# Patient Record
Sex: Female | Born: 1985 | Race: Black or African American | Hispanic: No | Marital: Single | Smoking: Current every day smoker
Health system: Southern US, Community
[De-identification: ages and names within clinical notes are randomized; demographics above are authoritative.]

## PROBLEM LIST (undated history)

## (undated) DIAGNOSIS — B379 Candidiasis, unspecified: Secondary | ICD-10-CM

## (undated) DIAGNOSIS — A499 Bacterial infection, unspecified: Secondary | ICD-10-CM

## (undated) DIAGNOSIS — A599 Trichomoniasis, unspecified: Secondary | ICD-10-CM

## (undated) DIAGNOSIS — Z8619 Personal history of other infectious and parasitic diseases: Secondary | ICD-10-CM

## (undated) DIAGNOSIS — Z87440 Personal history of urinary (tract) infections: Secondary | ICD-10-CM

## (undated) DIAGNOSIS — B1910 Unspecified viral hepatitis B without hepatic coma: Secondary | ICD-10-CM

## (undated) DIAGNOSIS — L02419 Cutaneous abscess of limb, unspecified: Secondary | ICD-10-CM

## (undated) DIAGNOSIS — D573 Sickle-cell trait: Secondary | ICD-10-CM

## (undated) HISTORY — DX: Candidiasis, unspecified: B37.9

## (undated) HISTORY — DX: Bacterial infection, unspecified: A49.9

## (undated) HISTORY — DX: Trichomoniasis, unspecified: A59.9

## (undated) HISTORY — DX: Personal history of other infectious and parasitic diseases: Z86.19

## (undated) HISTORY — PX: NO PAST SURGERIES: SHX2092

## (undated) HISTORY — DX: Unspecified viral hepatitis B without hepatic coma: B19.10

## (undated) HISTORY — DX: Personal history of urinary (tract) infections: Z87.440

## (undated) HISTORY — DX: Cutaneous abscess of limb, unspecified: L02.419

---
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
## 2007-03-19 DIAGNOSIS — A599 Trichomoniasis, unspecified: Secondary | ICD-10-CM

## 2007-03-19 HISTORY — DX: Trichomoniasis, unspecified: A59.9

## 2009-08-31 ENCOUNTER — Emergency Department (HOSPITAL_COMMUNITY): Admission: EM | Admit: 2009-08-31 | Discharge: 2009-08-31 | Payer: Self-pay | Admitting: Emergency Medicine

## 2010-06-03 LAB — URINE CULTURE: Colony Count: >=100000

## 2010-06-03 LAB — URINE MICROSCOPIC-ADD ON

## 2010-06-03 LAB — URINALYSIS, ROUTINE W REFLEX MICROSCOPIC
Bilirubin Urine: NEGATIVE
Glucose, UA: NEGATIVE mg/dL
Ketones, ur: NEGATIVE mg/dL
Nitrite: NEGATIVE
Protein, ur: NEGATIVE mg/dL
Specific Gravity, Urine: 1.01 (ref 1.005–1.030)
Urobilinogen, UA: 0.2 mg/dL (ref 0.0–1.0)
pH: 6.5 (ref 5.0–8.0)

## 2010-06-03 LAB — POCT PREGNANCY, URINE: Preg Test, Ur: NEGATIVE

## 2010-11-10 ENCOUNTER — Emergency Department: Payer: Self-pay | Admitting: Emergency Medicine

## 2010-12-04 LAB — RPR: RPR: NONREACTIVE

## 2010-12-04 LAB — HIV ANTIBODY (ROUTINE TESTING W REFLEX): HIV: NONREACTIVE

## 2010-12-04 LAB — ANTIBODY SCREEN: Antibody Screen: NEGATIVE

## 2010-12-04 LAB — RUBELLA ANTIBODY, IGM: Rubella: IMMUNE

## 2010-12-04 LAB — HEPATITIS B SURFACE ANTIGEN: Hepatitis B Surface Ag: NEGATIVE

## 2010-12-27 LAB — CULTURE, ROUTINE-ABSCESS

## 2011-02-10 ENCOUNTER — Emergency Department (HOSPITAL_COMMUNITY)
Admission: EM | Admit: 2011-02-10 | Discharge: 2011-02-10 | Disposition: A | Discharge status: Home / Self Care | Payer: Medicaid Other | Attending: Emergency Medicine | Admitting: Emergency Medicine

## 2011-02-10 DIAGNOSIS — J069 Acute upper respiratory infection, unspecified: Secondary | ICD-10-CM

## 2011-02-10 DIAGNOSIS — J45909 Unspecified asthma, uncomplicated: Secondary | ICD-10-CM | POA: Insufficient documentation

## 2011-02-10 DIAGNOSIS — O99891 Other specified diseases and conditions complicating pregnancy: Secondary | ICD-10-CM | POA: Insufficient documentation

## 2011-02-10 DIAGNOSIS — J029 Acute pharyngitis, unspecified: Secondary | ICD-10-CM | POA: Insufficient documentation

## 2011-02-10 DIAGNOSIS — Z87891 Personal history of nicotine dependence: Secondary | ICD-10-CM | POA: Insufficient documentation

## 2011-02-10 LAB — RAPID STREP SCREEN (MED CTR MEBANE ONLY): Streptococcus, Group A Screen (Direct): NEGATIVE

## 2011-02-10 MED ORDER — OXYMETAZOLINE HCL 0.05 % NA SOLN
2.0000 | Freq: Two times a day (BID) | NASAL | Status: DC
  Administered 2011-02-10: 2 via NASAL
  Filled 2011-02-10: qty 15

## 2011-02-10 MED ORDER — ACETAMINOPHEN 500 MG PO TABS
1000.0000 mg | ORAL_TABLET | Freq: Once | ORAL | Status: AC
  Administered 2011-02-10: 1000 mg via ORAL
  Filled 2011-02-10: qty 2

## 2011-02-10 NOTE — ED Provider Notes (Signed)
Medical screening examination/treatment/procedure(s) were performed by non-physician practitioner and as supervising physician I was immediately available for consultation/collaboration.  Camani Sesay, MD 02/10/11 2055 

## 2011-02-10 NOTE — ED Notes (Signed)
Pt presents with cough, sore throat, fever, and body aches since Friday. Pt states she is 6 month pregnant.

## 2011-02-10 NOTE — ED Provider Notes (Signed)
History     CSN: [id number] Arrival date & time: 02/10/2011  4:40 PM   First MD Initiated Contact with Patient 02/10/11 1641      Chief Complaint  Patient presents with  . Sore Throat  . Generalized Body Aches  . Fever  . Cough    (Consider location/radiation/quality/duration/timing/severity/associated sxs/prior treatment) HPI Comments: Pt report 2 to 3 days of sore throat, fever, cough, and body aches. Pt is 6 months pregnant. She has not been able to measure temp at home. No reported problem with the pregnancy. No dysuria.  Patient is a 25 y.o. female presenting with pharyngitis, fever, and cough. The history is provided by the patient.  Sore Throat This is a new problem. The current episode started in the past 7 days. The problem occurs 2 to 4 times per day. The problem has been unchanged. Associated symptoms include coughing, a fever, headaches, myalgias and a sore throat. Pertinent negatives include no abdominal pain, arthralgias, change in bowel habit, chest pain, neck pain, rash, visual change or vomiting. The symptoms are aggravated by swallowing. She has tried nothing for the symptoms. The treatment provided no relief.  Fever Primary symptoms of the febrile illness include fever, headaches, cough and myalgias. Primary symptoms do not include visual change, wheezing, shortness of breath, abdominal pain, vomiting, dysuria, arthralgias or rash.  The headache is not associated with photophobia or visual change.  Cough Associated symptoms include headaches, sore throat and myalgias. Pertinent negatives include no chest pain, no shortness of breath and no wheezing.    Past Medical History  Diagnosis Date  . Asthma     History reviewed. No pertinent past surgical history.  No family history on file.  History  Substance Use Topics  . Smoking status: Former Games developer  . Smokeless tobacco: Not on file  . Alcohol Use: No    OB History    Grav Para Term Preterm Abortions TAB  SAB Ect Mult Living   3    2 1 1    0      Review of Systems  Constitutional: Positive for fever. Negative for activity change.       All ROS Neg except as noted in HPI  HENT: Positive for sore throat. Negative for nosebleeds and neck pain.   Eyes: Negative for photophobia and discharge.  Respiratory: Positive for cough. Negative for shortness of breath and wheezing.   Cardiovascular: Negative for chest pain and palpitations.  Gastrointestinal: Negative for vomiting, abdominal pain, blood in stool and change in bowel habit.  Genitourinary: Negative for dysuria, frequency and hematuria.  Musculoskeletal: Positive for myalgias. Negative for back pain and arthralgias.  Skin: Negative.  Negative for rash.  Neurological: Positive for headaches. Negative for dizziness, seizures and speech difficulty.  Psychiatric/Behavioral: Negative for hallucinations and confusion.    Allergies  Review of patient's allergies indicates no known allergies.  Home Medications  No current outpatient prescriptions on file.  BP 115/67  Pulse 141  Temp(Src) 99.6 F (37.6 C) (Oral)  Resp 20  Ht 5\' 5"  (1.651 m)  Wt 150 lb (68.04 kg)  BMI 24.96 kg/m2  SpO2 100%  Physical Exam  Nursing note and vitals reviewed. Constitutional: She is oriented to person, place, and time. She appears well-developed and well-nourished.  Non-toxic appearance.  HENT:  Head: Normocephalic.  Right Ear: Tympanic membrane and external ear normal.  Left Ear: Tympanic membrane and external ear normal.       Nasal congestion noted. Mild increase redness of  the posterior pharynx. Airway clear.  Eyes: EOM and lids are normal. Pupils are equal, round, and reactive to light.  Neck: Normal range of motion. Neck supple. Carotid bruit is not present.  Cardiovascular: Normal rate, regular rhythm, normal heart sounds, intact distal pulses and normal pulses.   Pulmonary/Chest: Breath sounds normal. No respiratory distress.  Abdominal: Soft.  Bowel sounds are normal. There is no tenderness. There is no guarding.  Musculoskeletal: Normal range of motion.  Lymphadenopathy:       Head (right side): No submandibular adenopathy present.       Head (left side): No submandibular adenopathy present.    She has no cervical adenopathy.  Neurological: She is alert and oriented to person, place, and time. She has normal strength. No cranial nerve deficit or sensory deficit.  Skin: Skin is warm and dry.  Psychiatric: She has a normal mood and affect. Her speech is normal.    ED Course  Procedures (including critical care time)   Labs Reviewed  RAPID STREP SCREEN   No results found.   Dx: 1. Pharyngitis  2. URI  3. pregnancy   MDM  I have reviewed nursing notes, vital signs, and all appropriate lab and imaging results for this patient.        Kathie Dike, [place] 02/10/11 1807

## 2011-03-19 NOTE — L&D Delivery Note (Signed)
Delivery Note  Pt had urge to push and was complete and pushed well over 3 ctx, FHR remained stable at 140's with mild occ variables  At 4:39 AM a viable female was delivered via Vaginal, Spontaneous Delivery (Presentation: ROA;  ).  APGAR: 9, 9; weight 7 lb 14.5 oz (3585 g).   Placenta status: Intact, Spontaneous.  Cord: 3 vessels with the following complications: None. Prolonged ROM   Anesthesia: Local  Episiotomy: None Lacerations: 1st degree;Perineal, R periurethral  Suture Repair: 3.0 vicryl rapide Est. Blood Loss (mL): 350cc  Mom to postpartum.  Baby to nursery-stable. Plans to BF, plans outpatient circumcision   Malissa Hippo 06/02/2011, 5:31 AM

## 2011-05-21 ENCOUNTER — Encounter (INDEPENDENT_AMBULATORY_CARE_PROVIDER_SITE_OTHER): Payer: Medicaid Other | Admitting: Registered Nurse

## 2011-05-21 DIAGNOSIS — Z3480 Encounter for supervision of other normal pregnancy, unspecified trimester: Secondary | ICD-10-CM

## 2011-05-29 ENCOUNTER — Encounter (INDEPENDENT_AMBULATORY_CARE_PROVIDER_SITE_OTHER): Payer: Medicaid Other | Admitting: Obstetrics and Gynecology

## 2011-05-29 DIAGNOSIS — Z331 Pregnant state, incidental: Secondary | ICD-10-CM

## 2011-06-01 ENCOUNTER — Inpatient Hospital Stay (HOSPITAL_COMMUNITY)
Admission: AD | Admit: 2011-06-01 | Discharge: 2011-06-04 | DRG: 775 | Disposition: A | Discharge status: Home / Self Care | Payer: Medicaid Other | Source: Ambulatory Visit | Attending: Obstetrics and Gynecology | Admitting: Obstetrics and Gynecology

## 2011-06-01 ENCOUNTER — Encounter (HOSPITAL_COMMUNITY): Payer: Self-pay | Admitting: *Deleted

## 2011-06-01 DIAGNOSIS — B009 Herpesviral infection, unspecified: Secondary | ICD-10-CM | POA: Diagnosis not present

## 2011-06-01 DIAGNOSIS — D573 Sickle-cell trait: Secondary | ICD-10-CM | POA: Diagnosis present

## 2011-06-01 DIAGNOSIS — O9902 Anemia complicating childbirth: Secondary | ICD-10-CM | POA: Diagnosis present

## 2011-06-01 DIAGNOSIS — D649 Anemia, unspecified: Secondary | ICD-10-CM | POA: Diagnosis present

## 2011-06-01 DIAGNOSIS — O4290 Premature rupture of membranes, unspecified as to length of time between rupture and onset of labor, unspecified weeks of gestation: Secondary | ICD-10-CM | POA: Diagnosis present

## 2011-06-01 HISTORY — DX: Sickle-cell trait: D57.3

## 2011-06-01 LAB — CBC
HCT: 32.7 % — ABNORMAL LOW (ref 36.0–46.0)
Hemoglobin: 11.5 g/dL — ABNORMAL LOW (ref 12.0–15.0)
WBC: 8.8 10*3/uL (ref 4.0–10.5)

## 2011-06-01 MED ORDER — LACTATED RINGERS IV SOLN: 500.0000 mL | INTRAVENOUS | Status: DC | PRN

## 2011-06-01 MED ORDER — OXYCODONE-ACETAMINOPHEN 5-325 MG PO TABS: 1.0000 | ORAL_TABLET | ORAL | Status: DC | PRN

## 2011-06-01 MED ORDER — OXYTOCIN BOLUS FROM INFUSION
500.0000 mL | Freq: Once | INTRAVENOUS | Status: DC
  Filled 2011-06-01: qty 500
  Filled 2011-06-01: qty 1000

## 2011-06-01 MED ORDER — IBUPROFEN 600 MG PO TABS: 600.0000 mg | ORAL_TABLET | Freq: Four times a day (QID) | ORAL | Status: DC | PRN

## 2011-06-01 MED ORDER — OXYTOCIN 20 UNITS IN LACTATED RINGERS INFUSION - SIMPLE
125.0000 mL/h | Freq: Once | INTRAVENOUS | Status: AC
  Administered 2011-06-02: 999 mL/h via INTRAVENOUS

## 2011-06-01 MED ORDER — OXYTOCIN 10 UNIT/ML IJ SOLN: 10.0000 [IU] | Freq: Once | INTRAMUSCULAR | Status: DC

## 2011-06-01 MED ORDER — ONDANSETRON HCL 4 MG/2ML IJ SOLN: 4.0000 mg | Freq: Four times a day (QID) | INTRAMUSCULAR | Status: DC | PRN

## 2011-06-01 MED ORDER — LIDOCAINE HCL (PF) 1 % IJ SOLN
30.0000 mL | INTRAMUSCULAR | Status: DC | PRN
  Administered 2011-06-02: 30 mL via SUBCUTANEOUS
  Filled 2011-06-01: qty 30

## 2011-06-01 MED ORDER — FENTANYL CITRATE 0.05 MG/ML IJ SOLN
100.0000 ug | INTRAMUSCULAR | Status: DC | PRN
  Administered 2011-06-01 – 2011-06-02 (×5): 100 ug via INTRAVENOUS
  Filled 2011-06-01 (×6): qty 2

## 2011-06-01 MED ORDER — LACTATED RINGERS IV SOLN
INTRAVENOUS | Status: DC
  Administered 2011-06-01: 23:00:00 via INTRAVENOUS

## 2011-06-01 MED ORDER — ACETAMINOPHEN 325 MG PO TABS: 650.0000 mg | ORAL_TABLET | ORAL | Status: DC | PRN

## 2011-06-01 MED ORDER — CITRIC ACID-SODIUM CITRATE 334-500 MG/5ML PO SOLN
30.0000 mL | ORAL | Status: DC | PRN
  Filled 2011-06-01: qty 15

## 2011-06-01 NOTE — H&P (Signed)
Tina Hawkins is a 26 y.o. female presenting for onset of regular ctx since 2000, started having sporadic ctx since 0500 this am. Reports losing "mucous plug" on Thursday at 4pm and has felt "wet" since than, using pantiliner, noted bloody show since around 8pm. Reports GFM. Pt denies any s/s of HSV, has been on valtrex since NOB visit with active lesion noted at that time.  Unable to determine exact time for SROM.  Pregnancy significant for:  1. HSV - confidential  2. Remote hx asthma 3. SCT 4. Anemia 5. LTC 6. Hx trichomonas   HPI: Pt began [facility] at 14wks, she had a dating [place] at that time with [employer] 06/01/11, and unk LMP. She was started on valtrex at [facility] for +HSV lesion. Anatomy US and Quad screen were normal at 18wks. Rest of prenatal course was routine.   Maternal Medical History:  Reason for admission: Reason for admission: rupture of membranes and contractions.  Contractions: Onset was 3-5 hours ago.   Frequency: regular.   Duration is approximately 60 seconds.   Perceived severity is strong.    Fetal activity: Perceived fetal activity is normal.   Last perceived fetal movement was within the past hour.    Prenatal complications: no prenatal complications   OB History    Grav Para Term Preterm Abortions TAB SAB Ect Mult Living   4    3 2 1    0     2007 IAM at 10wks with heavy bleeding after and pt report of retained products 2010 SAB no comps  Past Medical History  Diagnosis Date  . Asthma   . Sickle cell trait    Past Surgical History  Procedure Date  . No past surgeries    Family History: family history includes Cancer in her maternal aunt; Diabetes in her maternal aunt; Heart disease in her mother; and Stroke in her mother. Social History:  reports that she has quit smoking. She does not have any smokeless tobacco history on file. She reports that she uses illicit drugs (Marijuana). She reports that she does not drink alcohol.  Review of Systems    Genitourinary:       Repots losing mucous plug about 4pm on thurs 03-14,  All other systems reviewed and are negative.    Dilation: 2 Effacement (%): 100 Station: 0 There were no vitals taken for this visit. Maternal Exam:  Uterine Assessment: Contraction strength is moderate.  Contraction duration is 60 seconds. Contraction frequency is regular.   Abdomen: Fundal height is aga.   Estimated fetal weight is 7-8.   Fetal presentation: vertex  Introitus: Normal vulva. Vagina is positive for vaginal discharge.  Ferning test: positive.  Amniotic fluid character: clear.  Pelvis: adequate for delivery.   Cervix: Cervix evaluated by digital exam.     Fetal Exam Fetal Monitor Review: Mode: ultrasound.   Baseline rate: 140.  Variability: moderate (6-25 bpm).   Pattern: accelerations present and no decelerations.    Fetal State Assessment: Category I - tracings are normal.     Physical Exam  Nursing note and vitals reviewed. Constitutional: She is oriented to person, place, and time. She appears well-developed and well-nourished. She appears distressed.       Grimaces and moans with ctx  HENT:  Head: Normocephalic.  Neck: Normal range of motion.  Cardiovascular: Normal rate and regular rhythm.   Respiratory: Effort normal and breath sounds normal.  GI: Soft. There is no tenderness.  Gravid, NT  Genitourinary: Vaginal discharge found.       Clear fluid, pos fern, spec exam clear no s/s HSV noted VE=2/100/0   Musculoskeletal: Normal range of motion. She exhibits no edema.  Neurological: She is alert and oriented to person, place, and time.  Skin: Skin is warm. She is diaphoretic.  Psychiatric: She has a normal mood and affect. Her behavior is normal. Judgment and thought content normal.    Prenatal labs: ABO, Rh: O/Positive/-- (09/18 0000) Antibody: Negative (09/18 0000) Rubella: Immune (09/18 0000) RPR: Nonreactive (09/18 0000)  HBsAg: Negative (09/18 0000)   HIV: Non-reactive (09/18 0000)  GBS: Negative (02/19 0000)  GC/CT neg Quad neg 28wk labs - 1hr gtt 114, hgb 10.3, RPR NR  Assessment/Plan: IUP at 40wks ? SROM time - possibly 2 days Early/active labor Hx HSV - confidential no current s/s GBS neg FHR reassuring  Admit to B.S. Dr Normand Sloop attending Routine CNM orders Fentanyl IVP for now, pt may want epidural Will watch closely for s/s chorioamnionitis    Tina Hawkins M 06/01/2011, 10:35 PM

## 2011-06-01 NOTE — Progress Notes (Signed)
Gevena Barre CNM in discussing admission and pain management with pt

## 2011-06-01 NOTE — MAU Note (Signed)
Pt states, " I've been leaking water for two days and I've been contracting on and off for 2-3 days, but regular and strong for one and half hours."

## 2011-06-01 NOTE — MAU Note (Signed)
Pt brought straight back to RM #7 via w/c due to discomfort from contractions

## 2011-06-01 NOTE — MAU Note (Signed)
Report called to Christy RN in BS. Will call back with room number. 

## 2011-06-01 NOTE — Progress Notes (Signed)
To Rm 168 via  w/c

## 2011-06-02 ENCOUNTER — Encounter (HOSPITAL_COMMUNITY): Payer: Self-pay | Admitting: *Deleted

## 2011-06-02 MED ORDER — BENZOCAINE-MENTHOL 20-0.5 % EX AERO
INHALATION_SPRAY | CUTANEOUS | Status: AC
  Administered 2011-06-02: 08:00:00
  Filled 2011-06-02: qty 56

## 2011-06-02 MED ORDER — PRENATAL MULTIVITAMIN CH
1.0000 | ORAL_TABLET | Freq: Every day | ORAL | Status: DC
  Administered 2011-06-02 – 2011-06-04 (×3): 1 via ORAL
  Filled 2011-06-02 (×3): qty 1

## 2011-06-02 MED ORDER — DIBUCAINE 1 % RE OINT: 1.0000 "application " | TOPICAL_OINTMENT | RECTAL | Status: DC | PRN

## 2011-06-02 MED ORDER — ZOLPIDEM TARTRATE 5 MG PO TABS: 5.0000 mg | ORAL_TABLET | Freq: Every evening | ORAL | Status: DC | PRN

## 2011-06-02 MED ORDER — LANOLIN HYDROUS EX OINT: TOPICAL_OINTMENT | CUTANEOUS | Status: DC | PRN

## 2011-06-02 MED ORDER — TETANUS-DIPHTH-ACELL PERTUSSIS 5-2.5-18.5 LF-MCG/0.5 IM SUSP
0.5000 mL | Freq: Once | INTRAMUSCULAR | Status: AC
  Administered 2011-06-03: 0.5 mL via INTRAMUSCULAR
  Filled 2011-06-02: qty 0.5

## 2011-06-02 MED ORDER — ONDANSETRON HCL 4 MG/2ML IJ SOLN: 4.0000 mg | INTRAMUSCULAR | Status: DC | PRN

## 2011-06-02 MED ORDER — BENZOCAINE-MENTHOL 20-0.5 % EX AERO: 1.0000 "application " | INHALATION_SPRAY | CUTANEOUS | Status: DC | PRN

## 2011-06-02 MED ORDER — IBUPROFEN 600 MG PO TABS
600.0000 mg | ORAL_TABLET | Freq: Four times a day (QID) | ORAL | Status: DC
  Administered 2011-06-02 – 2011-06-04 (×10): 600 mg via ORAL
  Filled 2011-06-02 (×10): qty 1

## 2011-06-02 MED ORDER — ONDANSETRON HCL 4 MG PO TABS: 4.0000 mg | ORAL_TABLET | ORAL | Status: DC | PRN

## 2011-06-02 MED ORDER — DIPHENHYDRAMINE HCL 25 MG PO CAPS: 25.0000 mg | ORAL_CAPSULE | Freq: Four times a day (QID) | ORAL | Status: DC | PRN

## 2011-06-02 MED ORDER — OXYCODONE-ACETAMINOPHEN 5-325 MG PO TABS
1.0000 | ORAL_TABLET | ORAL | Status: DC | PRN
  Administered 2011-06-03 – 2011-06-04 (×2): 1 via ORAL
  Filled 2011-06-02 (×2): qty 1

## 2011-06-02 MED ORDER — SENNOSIDES-DOCUSATE SODIUM 8.6-50 MG PO TABS
2.0000 | ORAL_TABLET | Freq: Every day | ORAL | Status: DC
  Administered 2011-06-02 – 2011-06-03 (×2): 2 via ORAL

## 2011-06-02 MED ORDER — WITCH HAZEL-GLYCERIN EX PADS: 1.0000 "application " | MEDICATED_PAD | CUTANEOUS | Status: DC | PRN

## 2011-06-02 MED ORDER — MEASLES, MUMPS & RUBELLA VAC ~~LOC~~ INJ
0.5000 mL | INJECTION | Freq: Once | SUBCUTANEOUS | Status: DC
  Filled 2011-06-02: qty 0.5

## 2011-06-02 MED ORDER — SIMETHICONE 80 MG PO CHEW: 80.0000 mg | CHEWABLE_TABLET | ORAL | Status: DC | PRN

## 2011-06-02 NOTE — Progress Notes (Signed)
Post Partum Day 0--delivered at 4:39am.  Now on Mother-Baby Subjective: no complaints.  Doing well, up ad lib without syncope or dizziness.  Objective: Blood pressure 110/71, pulse 106, temperature 98.6 F (37 C), temperature source Oral, resp. rate 16, height 5\' 4"  (1.626 m), weight 172 lb (78.019 kg), unknown if currently breastfeeding.  Physical Exam:  General: alert Lochia: appropriate Uterine Fundus: firm Incision: healing well DVT Evaluation: No evidence of DVT seen on physical exam. Negative Homan's sign.   Basename 06/01/11 2300  HGB 11.5*  HCT 32.7*    Assessment/Plan: Day 0 pp--stable Continue current care.   LOS: 1 day   Nigel Bridgeman 06/02/2011, 8:18 AM

## 2011-06-02 NOTE — Progress Notes (Signed)
Patient ID: CARMAN AUXIER, female   DOB: 03-21-1985, 26 y.o.   MRN: [id number] .Subjective:  Has rcv'd 4 doses fentanyl, with good results, not as much relief with last dose, family remains supportive at bs  Objective: BP 132/70  Pulse 80  Temp(Src) 98.6 F (37 C) (Oral)  Resp 20  Ht 5\' 4"  (1.626 m)  Wt 78.019 kg (172 lb)  BMI 29.52 kg/m2   FHT:  130, cat 1, occ variables, ? Early toco not tracing UC:   regular, every 2-3  minutes SVE:   Dilation: 7 Effacement (%): 100 Station: +1 Exam by:: s Darin Redmann cnm    Assessment / Plan: Spontaneous labor, progressing normally GBS neg  ? Prolonged ROM - no sx's chorioamnionitis   Fetal Wellbeing:  Category I Pain Control:  Fentanyl  Update physician PRN  Kadeshia Kasparian M 06/02/2011, 3:55 AM

## 2011-06-03 LAB — CBC
HCT: 24.9 % — ABNORMAL LOW (ref 36.0–46.0)
Hemoglobin: 8.7 g/dL — ABNORMAL LOW (ref 12.0–15.0)
MCH: 31.5 pg (ref 26.0–34.0)
MCHC: 34.9 g/dL (ref 30.0–36.0)
MCV: 90.2 fL (ref 78.0–100.0)
RDW: 13.2 % (ref 11.5–15.5)

## 2011-06-03 MED ORDER — POLYSACCHARIDE IRON COMPLEX 150 MG PO CAPS
150.0000 mg | ORAL_CAPSULE | Freq: Two times a day (BID) | ORAL | Status: DC
  Administered 2011-06-03 – 2011-06-04 (×2): 150 mg via ORAL
  Filled 2011-06-03 (×3): qty 1

## 2011-06-03 NOTE — Progress Notes (Signed)
Post Partum Day 1 Subjective: voiding, tolerating PO, + flatus and working on breastfeeding.  No dizziness thus far.  Per pt's RN, normal orthostatic VS.  VB lighter this AM.  Hispanic female at bedside holding newborn.    Objective: Blood pressure 106/72, pulse 109, temperature 98.4 F (36.9 C), temperature source Oral, resp. rate 20, height 5\' 4"  (1.626 m), weight 78.019 kg (172 lb), unknown if currently breastfeeding.  Physical Exam:  General: alert, cooperative and no distress Lochia: appropriate Uterine Fundus: firm Incision: n/a DVT Evaluation: No evidence of DVT seen on physical exam. Negative Homan's sign. No significant calf/ankle edema. .. Filed Vitals:   06/03/11 0519 06/03/11 0805 06/03/11 0808 06/03/11 0812  BP: 110/64 107/63 108/68 106/72  Pulse: 101 99 103 109  Temp: 98.4 F (36.9 C)     TempSrc: Oral     Resp: 20     Height:      Weight:        Basename 06/03/11 0525 06/01/11 2300  HGB 8.7* 11.5*  HCT 24.9* 32.7*    Assessment/Plan: Plan for discharge tomorrow, Breastfeeding and Lactation consult PP anemia.    Disc'd slow position changes. Will start po Fe supplement.   Planning outpatient circ.  LOS: 2 days   Claxton Levitz H 06/03/2011, 11:50 AM

## 2011-06-03 NOTE — Progress Notes (Signed)
UR chart review completed.  

## 2011-06-04 ENCOUNTER — Encounter: Payer: Medicaid Other | Admitting: Obstetrics and Gynecology

## 2011-06-04 MED ORDER — IBUPROFEN 600 MG PO TABS: 600.0000 mg | ORAL_TABLET | Freq: Four times a day (QID) | ORAL | Status: AC | PRN

## 2011-06-04 MED ORDER — DOCUSATE SODIUM 100 MG PO CAPS: 100.0000 mg | ORAL_CAPSULE | Freq: Two times a day (BID) | ORAL | Status: AC

## 2011-06-04 MED ORDER — FERROUS GLUCONATE IRON 246 (28 FE) MG PO TABS: 246.0000 mg | ORAL_TABLET | Freq: Every day | ORAL | Status: DC

## 2011-06-04 MED ORDER — NORETHINDRONE 0.35 MG PO TABS: 1.0000 | ORAL_TABLET | Freq: Every day | ORAL | Status: DC

## 2011-06-04 NOTE — Discharge Instructions (Signed)
Anemia, Frequently Asked Questions WHAT ARE THE SYMPTOMS OF ANEMIA?  Headache.   Difficulty thinking.   Fatigue.   Shortness of breath.   Weakness.   Rapid heartbeat.  AT WHAT POINT ARE PEOPLE CONSIDERED ANEMIC?  This varies with gender and age.   Both hemoglobin (Hgb) and hematocrit values are used to define anemia. These lab values are obtained from a complete blood count (CBC) test. This is performed at a caregiver's office.   The normal range of hemoglobin values for adult men is 14.0 g/dL to 16.1 g/dL. For nonpregnant women, values are 12.3 g/dL to 09.6 g/dL.   The World Health Organization defines anemia as less than 12 g/dL for nonpregnant women and less than 13 g/dL for men.   For adult males, the average normal hematocrit is 46%, and the range is 40% to 52%.   For adult females, the average normal hematocrit is 41%, and the range is 35% to 47%.   Values that fall below the lower limits can be a sign of anemia and should have further checking (evaluation).  GROUPS OF PEOPLE WHO ARE AT RISK FOR DEVELOPING ANEMIA INCLUDE:   Infants who are breastfed or taking a formula that is not fortified with iron.   Children going through a rapid growth spurt. The iron available can not keep up with the needs for a red cell mass which must grow with the child.   Women in childbearing years. They need iron because of blood loss during menstruation.   Pregnant women. The growing fetus creates a high demand for iron.   People with ongoing gastrointestinal blood loss are at risk of developing iron deficiency.   Individuals with leukemia or cancer who must receive chemotherapy or radiation to treat their disease. The drugs or radiation used to treat these diseases often decreases the bone marrow's ability to make cells of all classes. This includes red blood cells, white blood cells, and platelets.   Individuals with chronic inflammatory conditions such as rheumatoid arthritis or  chronic infections.   The elderly.  ARE SOME TYPES OF ANEMIA INHERITED?   Yes, some types of anemia are due to inherited or genetic defects.   Sickle cell anemia. This occurs most often in people of African, African American, and Mediterranean descent.   Thalassemia (or Cooley's anemia). This type is found in people of Mediterranean and Southeast Asian descent. These types of anemia are common.   [place]. This is rare.  CAN CERTAIN MEDICATIONS CAUSE A PERSON TO BECOME ANEMIC?  Yes. For example, drugs to fight cancer (chemotherapeutic agents) often cause anemia. These drugs can slow the bone marrow's ability to make red blood cells. If there are not enough red blood cells, the body does not get enough oxygen. WHAT HEMATOCRIT LEVEL IS REQUIRED TO DONATE BLOOD?  The lower limit of an acceptable hematocrit for blood donors is 38%. If you have a low hematocrit value, you should schedule an appointment with your caregiver. ARE BLOOD TRANSFUSIONS COMMONLY USED TO CORRECT ANEMIA, AND ARE THEY DANGEROUS?  They are used to treat anemia as a last resort. Your caregiver will find the cause of the anemia and correct it if possible. Most blood transfusions are given because of excessive bleeding at the time of surgery, with trauma, or because of bone marrow suppression in patients with cancer or leukemia on chemotherapy. Blood transfusions are safer than ever before. We also know that blood transfusions affect the immune system and may increase certain risks. There is  also a concern for human error. In 1/16,000 transfusions, a patient receives a transfusion of blood that is not matched with his or her blood type.  WHAT IS IRON DEFICIENCY ANEMIA AND CAN I CORRECT IT BY CHANGING MY DIET?  Iron is an essential part of hemoglobin. Without enough hemoglobin, anemia develops and the body does not get the right amount of oxygen. Iron deficiency anemia develops after the body has had a low level of iron for a long  time. This is either caused by blood loss, not taking in or absorbing enough iron, or increased demands for iron (like pregnancy or rapid growth).  Foods from animal origin such as beef, chicken, and pork, are good sources of iron. Be sure to have one of these foods at each meal. Vitamin C helps your body absorb iron. Foods rich in Vitamin C include citrus, bell pepper, strawberries, spinach and cantaloupe. In some cases, iron supplements may be needed in order to correct the iron deficiency. In the case of poor absorption, extra iron may have to be given directly into the vein through a needle (intravenously). I HAVE BEEN DIAGNOSED WITH IRON DEFICIENCY ANEMIA AND MY CAREGIVER PRESCRIBED IRON SUPPLEMENTS. HOW LONG WILL IT TAKE FOR MY BLOOD TO BECOME NORMAL?  It depends on the degree of anemia at the beginning of treatment. Most people with mild to moderate iron deficiency, anemia will correct the anemia over a period of 2 to 3 months. But after the anemia is corrected, the iron stored by the body is still low. Caregivers often suggest an additional 6 months of oral iron therapy once the anemia has been reversed. This will help prevent the iron deficiency anemia from quickly happening again. Non-anemic adult males should take iron supplements only under the direction of a doctor, too much iron can cause liver damage.  MY HEMOGLOBIN IS 9 G/DL AND I AM SCHEDULED FOR SURGERY. SHOULD I POSTPONE THE SURGERY?  If you have Hgb of 9, you should discuss this with your caregiver right away. Many patients with similar hemoglobin levels have had surgery without problems. If minimal blood loss is expected for a minor procedure, no treatment may be necessary.  If a greater blood loss is expected for more extensive procedures, you should ask your caregiver about being treated with erythropoietin and iron. This is to accelerate the recovery of your hemoglobin to a normal level before surgery. An anemic patient who undergoes  high-blood-loss surgery has a greater risk of surgical complications and need for a blood transfusion, which also carries some risk.  I HAVE BEEN TOLD THAT HEAVY MENSTRUAL PERIODS CAUSE ANEMIA. IS THERE ANYTHING I CAN DO TO PREVENT THE ANEMIA?  Anemia that results from heavy periods is usually due to iron deficiency. You can try to meet the increased demands for iron caused by the heavy monthly blood loss by increasing the intake of iron-rich foods. Iron supplements may be required. Discuss your concerns with your caregiver.  Breastfeeding Vaginal Delivery Care After  Change your pad on each trip to the bathroom.   Wipe gently with toilet paper during your hospital stay. Always wipe from front to back. A spray bottle with warm tap water could also be used or a towelette if available.   Place your soiled pad and toilet paper in a bathroom wastebasket with a plastic bag liner.   During your hospital stay, save any clots. If you pass a clot while on the toilet, do not flush it. Also, if your  vaginal flow seems excessive to you, notify nursing personnel.   The first time you get out of bed after delivery, wait for assistance from a nurse. Do not get up alone at any time if you feel weak or dizzy.   Bend and extend your ankles forcefully so that you feel the calves of your legs get hard. Do this 6 times every hour when you are in bed and awake.   Do not sit with one foot under you, dangle your legs over the edge of the bed, or maintain a position that hinders the circulation in your legs.   Many women experience after pains for 2 to 3 days after delivery. These after pains are mild uterine contractions. Ask the nurse for a pain medication if you need something for this. Sometimes breastfeeding stimulates after pains; if you find this to be true, ask for the medication  -  hour before the next feeding.   For you and your infant's protection, do not go beyond the door(s) of the obstetric unit. Do  not carry your baby in your arms in the hallway. When taking your baby to and from your room, put your baby in the bassinet and push the bassinet.   Mothers may have their babies in their room as much as they desire.  Document Released: 03/01/2000 Document Revised: 02/21/2011 Document Reviewed: 01/30/2007 [facility] Patient Information 2012 [place], [place].  BENEFITS OF BREASTFEEDING For the baby  The first milk (colostrum) helps the baby's digestive system function better.   There are antibodies from the mother in the milk that help the baby fight off infections.   The baby has a lower incidence of asthma, allergies, and SIDS (sudden infant death syndrome).   The nutrients in breast milk are better than formulas for the baby and helps the baby's brain grow better.   Babies who breastfeed have less gas, colic, and constipation.  For the mother  Breastfeeding helps develop a very special bond between mother and baby.   It is more convenient, always available at the correct temperature and cheaper than formula feeding.   It burns calories in the mother and helps with losing weight that was gained during pregnancy.   It makes the uterus contract back down to normal size faster and slows bleeding following delivery.   Breastfeeding mothers have a lower risk of developing breast cancer.  NURSE FREQUENTLY  A healthy, full-term baby may breastfeed as often as every hour or space his or her feedings to every 3 hours.   How often to nurse will vary from baby to baby. Watch your baby for signs of hunger, not the clock.   Nurse as often as the baby requests, or when you feel the need to reduce the fullness of your breasts.   Awaken the baby if it has been 3 to 4 hours since the last feeding.   Frequent feeding will help the mother make more milk and will prevent problems like sore nipples and engorgement of the breasts.  BABY'S POSITION AT THE BREAST  Whether lying down or sitting, be  sure that the baby's tummy is facing your tummy.   Support the breast with 4 fingers underneath the breast and the thumb above. Make sure your fingers are well away from the nipple and baby's mouth.   Stroke the baby's lips and cheek closest to the breast gently with your finger or nipple.   When the baby's mouth is open wide enough, place all of your  nipple and as much of the dark area around the nipple as possible into your baby's mouth.   Pull the baby in close so the tip of the nose and the baby's cheeks touch the breast during the feeding.  FEEDINGS  The length of each feeding varies from baby to baby and from feeding to feeding.   The baby must suck about 2 to 3 minutes for your milk to get to him or her. This is called a "let down." For this reason, allow the baby to feed on each breast as long as he or she wants. Your baby will end the feeding when he or she has received the right balance of nutrients.   To break the suction, put your finger into the corner of the baby's mouth and slide it between his or her gums before removing your breast from his or her mouth. This will help prevent sore nipples.  REDUCING BREAST ENGORGEMENT  In the first week after your baby is born, you may experience signs of breast engorgement. When breasts are engorged, they feel heavy, warm, full, and may be tender to the touch. You can reduce engorgement if you:   Nurse frequently, every 2 to 3 hours. Mothers who breastfeed early and often have fewer problems with engorgement.   Place light ice packs on your breasts between feedings. This reduces swelling. Wrap the ice packs in a lightweight towel to protect your skin.   Apply moist hot packs to your breast for 5 to 10 minutes before each feeding. This increases circulation and helps the milk flow.   Gently massage your breast before and during the feeding.   Make sure that the baby empties at least one breast at every feeding before switching sides.    Use a breast pump to empty the breasts if your baby is sleepy or not nursing well. You may also want to pump if you are returning to work or or you feel you are getting engorged.   Avoid bottle feeds, pacifiers or supplemental feedings of water or juice in place of breastfeeding.   Be sure the baby is latched on and positioned properly while breastfeeding.   Prevent fatigue, stress, and anemia.   Wear a supportive bra, avoiding underwire styles.   Eat a balanced diet with enough fluids.  If you follow these suggestions, your engorgement should improve in 24 to 48 hours. If you are still experiencing difficulty, call your lactation consultant or caregiver. IS MY BABY GETTING ENOUGH MILK? Sometimes, mothers worry about whether their babies are getting enough milk. You can be assured that your baby is getting enough milk if:  The baby is actively sucking and you hear swallowing.   The baby nurses at least 8 to 12 times in a 24 hour time period. Nurse your baby until he or she unlatches or falls asleep at the first breast (at least 10 to 20 minutes), then offer the second side.   The baby is wetting 5 to 6 disposable diapers (6 to 8 cloth diapers) in a 24 hour period by 6 to 57 days of age.   The baby is having at least 2 to 3 stools every 24 hours for the first few months. Breast milk is all the food your baby needs. It is not necessary for your baby to have water or formula. In fact, to help your breasts make more milk, it is best not to give your baby supplemental feedings during the early weeks.  The stool should be soft and yellow.   The baby should gain 4 to 7 ounces per week after he is 72 days old.  TAKE CARE OF YOURSELF Take care of your breasts by:  Bathing or showering daily.   Avoiding the use of soaps on your nipples.   Start feedings on your left breast at one feeding and on your right breast at the next feeding.   You will notice an increase in your milk supply 2 to 5  days after delivery. You may feel some discomfort from engorgement, which makes your breasts very firm and often tender. Engorgement "peaks" out within 24 to 48 hours. In the meantime, apply warm moist towels to your breasts for 5 to 10 minutes before feeding. Gentle massage and expression of some milk before feeding will soften your breasts, making it easier for your baby to latch on. Wear a well fitting nursing bra and air dry your nipples for 10 to 15 minutes after each feeding.   Only use cotton bra pads.   Only use pure lanolin on your nipples after nursing. You do not need to wash it off before nursing.  Take care of yourself by:   Eating well-balanced meals and nutritious snacks.   Drinking milk, fruit juice, and water to satisfy your thirst (about 8 glasses a day).   Getting plenty of rest.   Increasing calcium in your diet (1200 mg a day).   Avoiding foods that you notice affect the baby in a bad way.  SEEK MEDICAL CARE IF:   You have any questions or difficulty with breastfeeding.   You need help.   You have a hard, red, sore area on your breast, accompanied by a fever of 100.5 F (38.1 C) or more.   Your baby is too sleepy to eat well or is having trouble sleeping.   Your baby is wetting less than 6 diapers per day, by 42 days of age.   Your baby's skin or white part of his or her eyes is more yellow than it was in the hospital.   You feel depressed.  Document Released: 03/04/2005 Document Revised: 02/21/2011 Document Reviewed: 10/17/2008 [facility] Patient Information 2012 [place], [place]. Vaginal Delivery Care After  Change your pad on each trip to the bathroom.   Wipe gently with toilet paper during your hospital stay. Always wipe from front to back. A spray bottle with warm tap water could also be used or a towelette if available.   Place your soiled pad and toilet paper in a bathroom wastebasket with a plastic bag liner.   During your hospital stay, save any  clots. If you pass a clot while on the toilet, do not flush it. Also, if your vaginal flow seems excessive to you, notify nursing personnel.   The first time you get out of bed after delivery, wait for assistance from a nurse. Do not get up alone at any time if you feel weak or dizzy.   Bend and extend your ankles forcefully so that you feel the calves of your legs get hard. Do this 6 times every hour when you are in bed and awake.   Do not sit with one foot under you, dangle your legs over the edge of the bed, or maintain a position that hinders the circulation in your legs.   Many women experience after pains for 2 to 3 days after delivery. These after pains are mild uterine contractions. Ask the nurse for a pain  medication if you need something for this. Sometimes breastfeeding stimulates after pains; if you find this to be true, ask for the medication  -  hour before the next feeding.   For you and your infant's protection, do not go beyond the door(s) of the obstetric unit. Do not carry your baby in your arms in the hallway. When taking your baby to and from your room, put your baby in the bassinet and push the bassinet.   Mothers may have their babies in their room as much as they desire.  Document Released: 03/01/2000 Document Revised: 02/21/2011 Document Reviewed: 01/30/2007 [facility] Patient Information 2012 [place], [place].

## 2011-06-04 NOTE — Discharge Summary (Signed)
Obstetric Discharge Summary Reason for Admission: rupture of membranes Prenatal Procedures: ultrasound Intrapartum Procedures: spontaneous vaginal delivery Postpartum Procedures: none Complications-Operative and Postpartum: 1st degree perineal laceration  Problem list: HSV - confidential  2. Remote hx asthma  3. SCT  4. Anemia  5. LTC  6. Hx trichomonas   Hospital course: SROM, SVD, 1sst degree perineal laceration, normal involution, anemia  Hemoglobin  Date Value Range Status  06/03/2011 8.7* 12.0-15.0 (g/dL) Final     REPEATED TO VERIFY     DELTA CHECK NOTED     HCT  Date Value Range Status  06/03/2011 24.9* 36.0-46.0 (%) Final    Physical Exam:  General: alert, cooperative and no distress Lochia: appropriate Uterine Fundus: firm Incision: healing well DVT Evaluation: Negative Homan's sign.  Discharge Diagnoses: Term Pregnancy-delivered, normal involution, anemia  Discharge Information: Date: 06/04/2011 Activity: pelvic rest Diet: routine Medications: PNV, Ibuprofen, Iron and colace micronor RX discussed Condition: stable Instructions: refer to practice specific booklet Discharge to: home Follow-up Information    Follow up with [facility].         Newborn Data: Live born female  Birth Weight: 7 lb 14.5 oz (3585 g) APGAR: 9, 9  Home with mother.  Tina Hawkins 06/04/2011, 9:30 AM

## 2011-06-04 NOTE — Progress Notes (Signed)
Clinical Social Work Department  PSYCHOSOCIAL ASSESSMENT - MATERNAL/CHILD  06/04/2011  Patient: Blanchette,Callaway B Account Number: [id number] Admit Date: 06/01/2011  Childs Name:  Jose G. Salinas, Jr.   Clinical Social Worker: Milena Liggett, LCSWA Date/Time: 06/03/2011 12:30 PM  Date Referred: 06/03/2011  Referral source   CN    Referred reason   Substance Abuse   Other referral source:  I: FAMILY / HOME ENVIRONMENT  Child's legal guardian: PARENT  Guardian - Name  Guardian - Age  Guardian - Address   Kamauri Rexrode  25  [address] [place], [place] [address]   Jose Salinas  26    Other household support members/support persons  Name  Relationship  DOB   Sylvia Popoca  MOTHER    Other support:  II PSYCHOSOCIAL DATA  Information Source: Patient Interview  Financial and Community Resources  Employment:  Financial resources: Medicaid  If Medicaid - County: [place]  School / Grade:  Maternity Care Coordinator / Child Services Coordination / Early Interventions: Cultural issues impacting care:  III STRENGTHS  Strenghts   Home prepared for Child (including basic supplies)   Adequate Resources   Supportive family/friends   Strength comment:  IV RISK FACTORS AND CURRENT PROBLEMS  No Problem Noted: Y  Risk Factor & Current Problem  Patient Issue  Family Issue  Risk Factor / Current Problem Comment   Substance Abuse  Y  N  MJ use    N  N    V SOCIAL WORK ASSESSMENT  Pt admits to smoking MJ daily prior to pregnancy confirmation at 3 months. Once pregnancy was confirmed, she "slowed down," estimating that she smoked "every other day." Pt explained that she was sick the entire pregnancy and could not gain an appetite. The MJ helped with nausea and increased her appetite. Additionally, pt told Sw that she was depressed at the beginning of pregnancy and self medicated. Pt explained that her pregnancy was unplanned which was the source of depressed feelings. Pt has the support of the FOB and her mother  and reports feeling happy now. Pt last smoked, 2 weeks ago. She denies other illegal substance use. Sw informed pt about hospital drug testing policy and told pt about positive drug screen results. Pt did not appears worried about CPS involvement. She has all the necessary supplies for the infant. Sw will follow up with meconium results and continue to assist as needed until discharge.   VI SOCIAL WORK PLAN  Social Work Plan   Child Protective Services Report   No Further Intervention Required / No Barriers to Discharge   Type of pt/family education:  If child protective services report - county: [place]  If child protective services report - date: 06/03/2011  Information/referral to community resources comment:  Other social work plan:   

## 2011-06-13 ENCOUNTER — Encounter (INDEPENDENT_AMBULATORY_CARE_PROVIDER_SITE_OTHER): Payer: Medicaid Other | Admitting: Obstetrics and Gynecology

## 2011-06-13 DIAGNOSIS — L02419 Cutaneous abscess of limb, unspecified: Secondary | ICD-10-CM

## 2011-06-13 DIAGNOSIS — L0291 Cutaneous abscess, unspecified: Secondary | ICD-10-CM

## 2011-06-13 HISTORY — DX: Cutaneous abscess of limb, unspecified: L02.419

## 2011-06-20 ENCOUNTER — Encounter (INDEPENDENT_AMBULATORY_CARE_PROVIDER_SITE_OTHER): Payer: Medicaid Other | Admitting: Obstetrics and Gynecology

## 2011-06-20 DIAGNOSIS — L0390 Cellulitis, unspecified: Secondary | ICD-10-CM

## 2011-07-15 ENCOUNTER — Encounter: Payer: Self-pay | Admitting: Obstetrics and Gynecology

## 2011-07-15 ENCOUNTER — Ambulatory Visit (INDEPENDENT_AMBULATORY_CARE_PROVIDER_SITE_OTHER): Payer: Medicaid Other | Admitting: Obstetrics and Gynecology

## 2011-07-15 NOTE — Progress Notes (Signed)
Date of delivery: 06/02/2011 Female Name: Elita Quick'  Vaginal delivery:yes Cesarean section:no Tubal ligation:no GDM:no Breast Feeding:no Bottle Feeding:yes Post-Partum Blues:no Abnormal pap:no Normal GU function: yes Normal GI function:yes Returning to work:pt is not working right now.  EPDS = 5  No complaints Filed Vitals:   07/15/11 1602  BP: 110/70  Temp: 98.2 F (36.8 C)  Resp: 14   Pelvic exam: normal external genitalia, vulva, vagina, cervix, uterus and adnexa. On menses now.  A/P Doing well s/p vaginal delivery Using OCPs plans to continue RTO October for AEX with pap

## 2011-08-19 ENCOUNTER — Encounter: Payer: Self-pay | Admitting: Registered Nurse

## 2011-08-20 ENCOUNTER — Other Ambulatory Visit: Payer: Self-pay | Admitting: Obstetrics and Gynecology

## 2011-08-20 NOTE — Telephone Encounter (Signed)
Triage/epic 

## 2011-08-21 NOTE — Telephone Encounter (Signed)
PT CALLED REQUESTING A RF OF ZOVIRAX CREAM, PT INFORMED SHOULD HAVE RFS REMAINING, HAD RX WRITTEN IN 03/2011 FOR RF'S X 1 YEAR, PT ADVISED TO CALL HER PHARMACY TO HAVE IT RF'D, PT VOICES UNDERSTANDING, WILL CB WITH ANY CONCERNS.

## 2011-08-21 NOTE — Telephone Encounter (Signed)
Triage/epic 

## 2011-12-28 ENCOUNTER — Other Ambulatory Visit: Payer: Self-pay | Admitting: Obstetrics and Gynecology

## 2012-05-29 ENCOUNTER — Emergency Department (HOSPITAL_COMMUNITY)
Admission: EM | Admit: 2012-05-29 | Discharge: 2012-05-29 | Disposition: A | Discharge status: Home / Self Care | Payer: Self-pay | Attending: Emergency Medicine | Admitting: Emergency Medicine

## 2012-05-29 ENCOUNTER — Encounter (HOSPITAL_COMMUNITY): Payer: Self-pay | Admitting: *Deleted

## 2012-05-29 DIAGNOSIS — N938 Other specified abnormal uterine and vaginal bleeding: Secondary | ICD-10-CM | POA: Insufficient documentation

## 2012-05-29 DIAGNOSIS — Z872 Personal history of diseases of the skin and subcutaneous tissue: Secondary | ICD-10-CM | POA: Insufficient documentation

## 2012-05-29 DIAGNOSIS — N939 Abnormal uterine and vaginal bleeding, unspecified: Secondary | ICD-10-CM

## 2012-05-29 DIAGNOSIS — Z8742 Personal history of other diseases of the female genital tract: Secondary | ICD-10-CM | POA: Insufficient documentation

## 2012-05-29 DIAGNOSIS — Z3202 Encounter for pregnancy test, result negative: Secondary | ICD-10-CM | POA: Insufficient documentation

## 2012-05-29 DIAGNOSIS — Z8619 Personal history of other infectious and parasitic diseases: Secondary | ICD-10-CM | POA: Insufficient documentation

## 2012-05-29 DIAGNOSIS — Z862 Personal history of diseases of the blood and blood-forming organs and certain disorders involving the immune mechanism: Secondary | ICD-10-CM | POA: Insufficient documentation

## 2012-05-29 DIAGNOSIS — R42 Dizziness and giddiness: Secondary | ICD-10-CM | POA: Insufficient documentation

## 2012-05-29 DIAGNOSIS — N949 Unspecified condition associated with female genital organs and menstrual cycle: Secondary | ICD-10-CM | POA: Insufficient documentation

## 2012-05-29 DIAGNOSIS — R109 Unspecified abdominal pain: Secondary | ICD-10-CM | POA: Insufficient documentation

## 2012-05-29 DIAGNOSIS — J45909 Unspecified asthma, uncomplicated: Secondary | ICD-10-CM | POA: Insufficient documentation

## 2012-05-29 DIAGNOSIS — Z87891 Personal history of nicotine dependence: Secondary | ICD-10-CM | POA: Insufficient documentation

## 2012-05-29 LAB — CBC WITH DIFFERENTIAL/PLATELET
Basophils Absolute: 0 10*3/uL (ref 0.0–0.1)
Basophils Relative: 0 % (ref 0–1)
Eosinophils Absolute: 0.1 10*3/uL (ref 0.0–0.7)
Eosinophils Relative: 1 % (ref 0–5)
HCT: 40.9 % (ref 36.0–46.0)
Lymphocytes Relative: 21 % (ref 12–46)
MCH: 32.6 pg (ref 26.0–34.0)
MCHC: 35.2 g/dL (ref 30.0–36.0)
MCV: 92.5 fL (ref 78.0–100.0)
Monocytes Absolute: 0.9 10*3/uL (ref 0.1–1.0)
Platelets: 199 10*3/uL (ref 150–400)
RDW: 13.7 % (ref 11.5–15.5)
WBC: 10.9 10*3/uL — ABNORMAL HIGH (ref 4.0–10.5)

## 2012-05-29 MED ORDER — HYDROCODONE-ACETAMINOPHEN 5-325 MG PO TABS: 2.0000 | ORAL_TABLET | ORAL | Status: DC | PRN

## 2012-05-29 NOTE — ED Notes (Signed)
Pt having bleeding at present. Small amount of blood noted in floor. C/o pain MD advised

## 2012-05-29 NOTE — ED Notes (Signed)
Pt states abdominal cramping and vaginal bleeding began at 1430. Bright red blood with large clots. LMP was "sometime last month"

## 2012-05-29 NOTE — ED Provider Notes (Signed)
History  This chart was scribed for Hurman Horn, MD by Erskine Emery, ED Scribe. This patient was seen in room APA01/APA01 and the patient's care was started at 18:36.   CSN: [id number]  Arrival date & time 05/29/12  1633   First MD Initiated Contact with Patient 05/29/12 1836      Chief Complaint  Patient presents with  . Abdominal Pain  . Vaginal Bleeding    (Consider location/radiation/quality/duration/timing/severity/associated sxs/prior treatment) The history is provided by the patient. No language interpreter was used.  Tina Hawkins is a 27 y.o. female who presents to the Emergency Department complaining of abdominal cramping and significant bright red vaginal bleeding since 14:30. Pt reports she passed a lot of clots and possibly some white tissue. She has gone through several pads today, which is abnormal for her. Her LNMP was 1-2 months ago. She did not take any form of pregnancy test. She reports some associated lightheadedness but denies any associated LOC, fever, syncope, SOB, chest pain, fever, dysuria, or vaginal discharge. Pt has not been on birth control in years. Pt was G2 P1 A1 before last month. She reports her fist pregnancy was very normal. Pt denies any cocaine use.  Past Medical History  Diagnosis Date  . Sickle cell trait   . Candidiasis   . Yeast infection   . Bacterial infection   . Abscess of thigh 06/13/11  . Hepatitis B     vaccines x3  . Trichomonas 2009    treated  . H/O cystitis   . Asthma     as a child  . H/O herpes simplex infection     Past Surgical History  Procedure Laterality Date  . No past surgeries      Family History  Problem Relation Age of Onset  . Heart disease Mother   . Stroke Mother   . Hypertension Mother   . Cancer Maternal Aunt   . Diabetes Maternal Aunt   . Hypertension Maternal Aunt   . Anesthesia problems Neg Hx   . Heart disease Maternal Grandmother   . Heart disease Maternal Grandfather   . Hypertension  Cousin   . Kidney disease Cousin     History  Substance Use Topics  . Smoking status: Former Games developer  . Smokeless tobacco: Not on file  . Alcohol Use: No    OB History   Grav Para Term Preterm Abortions TAB SAB Ect Mult Living   4 1 1  3 2 1   1       Review of Systems 10 Systems reviewed and are negative for acute change except as noted in the HPI.   Allergies  Dust mite extract  Home Medications   Current Outpatient Rx  Name  Route  Sig  Dispense  Refill  . ibuprofen (ADVIL,MOTRIN) 200 MG tablet   Oral   Take 200 mg by mouth every 6 (six) hours as needed for pain.         Marland Kitchen HYDROcodone-acetaminophen (NORCO) 5-325 MG per tablet   Oral   Take 2 tablets by mouth every 4 (four) hours as needed for pain.   6 tablet   0     Triage vitals: BP 104/72  Pulse 105  Temp(Src) 98.4 F (36.9 C) (Oral)  Resp 18  SpO2 100%  Physical Exam  Nursing note and vitals reviewed. Constitutional:  Awake, alert, nontoxic appearance.  HENT:  Head: Atraumatic.  Eyes: Right eye exhibits no discharge. Left eye exhibits no discharge.  Neck: Neck supple.  Cardiovascular: Regular rhythm and normal heart sounds.  Tachycardia present.   No murmur heard. Mildly tachy.  Pulmonary/Chest: Effort normal. She exhibits no tenderness.  Abdominal: Soft. There is no tenderness. There is no rebound and no guarding.  Minimal upper abdominal pain. Diffuse moderate suprapubic tenderness.  Genitourinary:  Upon bimanial exam: cervix is closed. No cervical motion tenderness. No adnexal tenderness. Curently a small amount of blood on the examination glove. No clots. Chaperone present.  Musculoskeletal: She exhibits no tenderness.  Baseline ROM, no obvious new focal weakness.  Neurological:  Mental status and motor strength appears baseline for patient and situation.  Skin: No rash noted.  Psychiatric: She has a normal mood and affect.    ED Course  Procedures (including critical care  time) DIAGNOSTIC STUDIES: Oxygen Saturation is 100% on room air, normal by my interpretation.    COORDINATION OF CARE: 18:45--Patient / Family / Caregiver informed of clinical course, understand medical decision-making process, and agree with plan. Treatment plan includes: pregnancy test, possible ultrasound, and pelvic exam.  18:50--I performed a pelvic exam with chaperone present.  Results for orders placed during the hospital encounter of 05/29/12  PREGNANCY, URINE      Result Value Range   Preg Test, Ur NEGATIVE  NEGATIVE  CBC WITH DIFFERENTIAL      Result Value Range   WBC 10.9 (*) 4.0 - 10.5 K/uL   RBC 4.42  3.87 - 5.11 MIL/uL   Hemoglobin 14.4  12.0 - 15.0 g/dL   HCT 78.2  95.6 - 21.3 %   MCV 92.5  78.0 - 100.0 fL   MCH 32.6  26.0 - 34.0 pg   MCHC 35.2  30.0 - 36.0 g/dL   RDW 08.6  57.8 - 46.9 %   Platelets 199  150 - 400 K/uL   Neutrophils Relative 70  43 - 77 %   Neutro Abs 7.6  1.7 - 7.7 K/uL   Lymphocytes Relative 21  12 - 46 %   Lymphs Abs 2.3  0.7 - 4.0 K/uL   Monocytes Relative 8  3 - 12 %   Monocytes Absolute 0.9  0.1 - 1.0 K/uL   Eosinophils Relative 1  0 - 5 %   Eosinophils Absolute 0.1  0.0 - 0.7 K/uL   Basophils Relative 0  0 - 1 %   Basophils Absolute 0.0  0.0 - 0.1 K/uL       1. Abnormal vaginal bleeding       MDM   I doubt any other EMC precluding discharge at this time including, but not necessarily limited to the following:ectopic.  I personally performed the services described in this documentation, which was scribed in my presence. The recorded information has been reviewed and is accurate.    Hurman Horn, MD 05/30/12 [phone]

## 2012-06-06 ENCOUNTER — Telehealth: Payer: Self-pay | Admitting: Obstetrics and Gynecology

## 2012-06-06 ENCOUNTER — Inpatient Hospital Stay (HOSPITAL_COMMUNITY)
Admission: AD | Admit: 2012-06-06 | Discharge: 2012-06-06 | Disposition: A | Discharge status: Home / Self Care | Payer: Self-pay | Source: Ambulatory Visit | Attending: Obstetrics and Gynecology | Admitting: Obstetrics and Gynecology

## 2012-06-06 ENCOUNTER — Inpatient Hospital Stay (HOSPITAL_COMMUNITY): Payer: Self-pay

## 2012-06-06 ENCOUNTER — Encounter (HOSPITAL_COMMUNITY): Payer: Self-pay | Admitting: Obstetrics and Gynecology

## 2012-06-06 DIAGNOSIS — N920 Excessive and frequent menstruation with regular cycle: Secondary | ICD-10-CM | POA: Insufficient documentation

## 2012-06-06 DIAGNOSIS — N949 Unspecified condition associated with female genital organs and menstrual cycle: Secondary | ICD-10-CM | POA: Insufficient documentation

## 2012-06-06 DIAGNOSIS — R42 Dizziness and giddiness: Secondary | ICD-10-CM | POA: Insufficient documentation

## 2012-06-06 DIAGNOSIS — Z3202 Encounter for pregnancy test, result negative: Secondary | ICD-10-CM | POA: Insufficient documentation

## 2012-06-06 DIAGNOSIS — R109 Unspecified abdominal pain: Secondary | ICD-10-CM | POA: Insufficient documentation

## 2012-06-06 LAB — COMPREHENSIVE METABOLIC PANEL
CO2: 23 mEq/L (ref 19–32)
Calcium: 9.5 mg/dL (ref 8.4–10.5)
Creatinine, Ser: 0.86 mg/dL (ref 0.50–1.10)
GFR calc Af Amer: >90 mL/min (ref >90)
GFR calc non Af Amer: >90 mL/min (ref >90)
Glucose, Bld: 108 mg/dL — ABNORMAL HIGH (ref 70–99)
Total Protein: 6.9 g/dL (ref 6.0–8.3)

## 2012-06-06 LAB — CBC WITH DIFFERENTIAL/PLATELET
Basophils Absolute: 0 10*3/uL (ref 0.0–0.1)
Eosinophils Absolute: 0.1 10*3/uL (ref 0.0–0.7)
Eosinophils Relative: 1 % (ref 0–5)
HCT: 25.2 % — ABNORMAL LOW (ref 36.0–46.0)
Lymphocytes Relative: 21 % (ref 12–46)
Lymphs Abs: 1.9 10*3/uL (ref 0.7–4.0)
MCH: 30 pg (ref 26.0–34.0)
MCV: 86.9 fL (ref 78.0–100.0)
Monocytes Absolute: 0.4 10*3/uL (ref 0.1–1.0)
RDW: 12.2 % (ref 11.5–15.5)
WBC: 9.3 10*3/uL (ref 4.0–10.5)

## 2012-06-06 LAB — URINALYSIS, ROUTINE W REFLEX MICROSCOPIC
Bilirubin Urine: NEGATIVE
Ketones, ur: NEGATIVE mg/dL
Leukocytes, UA: NEGATIVE
Nitrite: NEGATIVE
Protein, ur: NEGATIVE mg/dL
pH: 7 (ref 5.0–8.0)

## 2012-06-06 MED ORDER — FERROUS SULFATE 325 (65 FE) MG PO TABS: 325.0000 mg | ORAL_TABLET | Freq: Two times a day (BID) | ORAL | Status: DC

## 2012-06-06 MED ORDER — ONDANSETRON HCL 4 MG/2ML IJ SOLN
4.0000 mg | Freq: Once | INTRAMUSCULAR | Status: AC
  Administered 2012-06-06: 4 mg via INTRAVENOUS
  Filled 2012-06-06: qty 2

## 2012-06-06 MED ORDER — LACTATED RINGERS IV BOLUS (SEPSIS)
500.0000 mL | Freq: Once | INTRAVENOUS | Status: AC
  Administered 2012-06-06: 500 mL via INTRAVENOUS

## 2012-06-06 MED ORDER — HYDROCODONE-ACETAMINOPHEN 5-325 MG PO TABS: 1.0000 | ORAL_TABLET | Freq: Four times a day (QID) | ORAL | Status: DC | PRN

## 2012-06-06 MED ORDER — LACTATED RINGERS IV SOLN: INTRAVENOUS | Status: DC

## 2012-06-06 MED ORDER — KETOROLAC TROMETHAMINE 30 MG/ML IJ SOLN
30.0000 mg | Freq: Once | INTRAMUSCULAR | Status: AC
  Administered 2012-06-06: 30 mg via INTRAVENOUS
  Filled 2012-06-06: qty 1

## 2012-06-06 NOTE — Telephone Encounter (Signed)
Error

## 2012-06-06 NOTE — MAU Provider Note (Signed)
History   27 yo G3P1021 presented unannounced via EMS with vaginal bleeding, dizziness, vomiting, and severe cramping.  Seen at [facility] 3/14 for complaint of vaginal bleeding and pelvic pain--had negative UPT and normal CBC.  LNMP was 1-2 months ago (patient unsure)--not using contraception, but "wants to get on some".    Bleeding began 3/14, was very heavy, which caused patient to go to ER for evaluation.  Next day, bleeding seemed to be diminishing, but did continue moderately through the week, with small clots passed.  Today at work, she felt pressure in the vagina, as if she needed to pass something, but was unable to do so.  Subsequently, she lifted a heavy tray and began to have severe pelvic pain and cramping, with heavy bleeding again.  She "thought she had a miscarriage in [place] UPT done, but had episode of heavy bleeding.  Has OCP Rx at home, but has not been on any form of contraception this year.  Cared for by [facility] during pregnancy in 05/2011.   CBC on 3/14 = 14.4  Patient Active Problem List  Diagnosis  . HSV-2 (herpes simplex virus 2) infection  . Sickle cell trait    Chief Complaint  Patient presents with  . Abdominal Pain  . Vaginal Bleeding  . Dizziness     OB History   Grav Para Term Preterm Abortions TAB SAB Ect Mult Living   3 1 1  2 1 1   1       Past Medical History  Diagnosis Date  . Sickle cell trait   . Candidiasis   . Yeast infection   . Bacterial infection   . Abscess of thigh 06/13/11  . Hepatitis B     vaccines x3  . Trichomonas 2009    treated  . H/O cystitis   . Asthma     as a child  . H/O herpes simplex infection     Past Surgical History  Procedure Laterality Date  . No past surgeries      Family History  Problem Relation Age of Onset  . Heart disease Mother   . Stroke Mother   . Hypertension Mother   . Cancer Maternal Aunt   . Diabetes Maternal Aunt   . Hypertension Maternal Aunt   . Anesthesia problems Neg Hx   .  Heart disease Maternal Grandmother   . Heart disease Maternal Grandfather   . Hypertension Cousin   . Kidney disease Cousin     History  Substance Use Topics  . Smoking status: Former Games developer  . Smokeless tobacco: Not on file  . Alcohol Use: No    Allergies:  Allergies  Allergen Reactions  . Dust Mite Extract     Prescriptions prior to admission  Medication Sig Dispense Refill  . IBUPROFEN PO Take 2 tablets by mouth as needed (cramps).         Physical Exam   Blood pressure 146/74, pulse 91, temperature 97.9 F (36.6 C), temperature source Oral, resp. rate 18, SpO2 100.00%.  In significant discomfort with severe cramping. Chest clear Heart RRR without murmur Abd--+ tenderness in lower pelvis bilaterally, negative rebound or guarding. Pelvic--deferred at present until pain med administered Ext WNL Back--negative CVAT    ED Course  Pelvic pain Vaginal bleeding Abnormal LMP  Plan: IV bolus Zofran IV Toradol IV CBC, diff, CMP UA UPT Pelvic US GC, chlamydia Orthostatic vs  Nigel Bridgeman, CNM 06/06/12 2:35p   Nigel Bridgeman CNM, MN 06/06/2012 2:07 PM  Addendum: Returned from [place].  Feeling better, cramping down to a "4" from "20". Did feel weak in [place] when moving around.b Able to sit up with minimal dizziness.  IMPRESSION:  Large relatively homogeneous echogenic mass within the endometrial  canal measuring 3.3 x 3.0 cm with associates small fluid. Legrand Rams  this to represent a large hematoma. No significant vascularity to  suggest retained products of conception.  Original Report Authenticated By: Genevive Bi, M.D.  Results for orders placed during the hospital encounter of 06/06/12 (from the past 24 hour(s))  URINALYSIS, ROUTINE W REFLEX MICROSCOPIC     Status: None   Collection Time    06/06/12  2:08 PM      Result Value Range   Color, Urine YELLOW  YELLOW   APPearance CLEAR  CLEAR   Specific Gravity, Urine 1.020  1.005 - 1.030   pH 7.0  5.0 -  8.0   Glucose, UA NEGATIVE  NEGATIVE mg/dL   Hgb urine dipstick NEGATIVE  NEGATIVE   Bilirubin Urine NEGATIVE  NEGATIVE   Ketones, ur NEGATIVE  NEGATIVE mg/dL   Protein, ur NEGATIVE  NEGATIVE mg/dL   Urobilinogen, UA 0.2  0.0 - 1.0 mg/dL   Nitrite NEGATIVE  NEGATIVE   Leukocytes, UA NEGATIVE  NEGATIVE  POCT PREGNANCY, URINE     Status: None   Collection Time    06/06/12  2:18 PM      Result Value Range   Preg Test, Ur NEGATIVE  NEGATIVE  CBC WITH DIFFERENTIAL     Status: Abnormal   Collection Time    06/06/12  2:30 PM      Result Value Range   WBC 9.3  4.0 - 10.5 K/uL   RBC 2.90 (*) 3.87 - 5.11 MIL/uL   Hemoglobin 8.7 (*) 12.0 - 15.0 g/dL   HCT 08.6 (*) 57.8 - 46.9 %   MCV 86.9  78.0 - 100.0 fL   MCH 30.0  26.0 - 34.0 pg   MCHC 34.5  30.0 - 36.0 g/dL   RDW 62.9  52.8 - 41.3 %   Platelets 229  150 - 400 K/uL   Neutrophils Relative 74  43 - 77 %   Neutro Abs 6.8  1.7 - 7.7 K/uL   Lymphocytes Relative 21  12 - 46 %   Lymphs Abs 1.9  0.7 - 4.0 K/uL   Monocytes Relative 5  3 - 12 %   Monocytes Absolute 0.4  0.1 - 1.0 K/uL   Eosinophils Relative 1  0 - 5 %   Eosinophils Absolute 0.1  0.0 - 0.7 K/uL   Basophils Relative 0  0 - 1 %   Basophils Absolute 0.0  0.0 - 0.1 K/uL  COMPREHENSIVE METABOLIC PANEL     Status: Abnormal   Collection Time    06/06/12  2:30 PM      Result Value Range   Sodium 137  135 - 145 mEq/L   Potassium 3.1 (*) 3.5 - 5.1 mEq/L   Chloride 101  96 - 112 mEq/L   CO2 23  19 - 32 mEq/L   Glucose, Bld 108 (*) 70 - 99 mg/dL   BUN 9  6 - 23 mg/dL   Creatinine, Ser 2.44  0.50 - 1.10 mg/dL   Calcium 9.5  8.4 - 01.0 mg/dL   Total Protein 6.9  6.0 - 8.3 g/dL   Albumin 4.0  3.5 - 5.2 g/dL   AST 13  0 - 37 U/L   ALT 6  0 - 35 U/L   Alkaline Phosphatase 41  39 - 117 U/L   Total Bilirubin 0.7  0.3 - 1.2 mg/dL   GFR calc non Af Amer >90  >90 mL/min   GFR calc Af Amer >90  >90 mL/min    Filed Vitals:   06/06/12 1332 06/06/12 1459  BP: 146/74 126/77  Pulse:  91 78  Temp: 97.9 F (36.6 C)   TempSrc: Oral   Resp: 18   SpO2: 100%    Pelvic--minimal bleeding at present.  Moderate clot in os, removed without difficulty.  Cervix is 1 cm, NT. Uterus slightly enlarged, NT.  Adnexa without masses, NT. GC, chlamydia done.  Consulted with Dr. Stefano Gaul. He will see patient in MAU.  Nigel Bridgeman, CNM 06/06/12 4:30p  Addendum: Patient seen by Dr. Stefano Gaul. Orthostatics remarkable for elevated pulse throughout, but patient was clinically stable during trial of walking in the room and going to the bathroom.  Denied HA, dizziness, SOB, or syncope. Per previous note from Dr. Stefano Gaul, patient to be d/c'd home. Rx Vicodin 5/325 1 po q 4 hours prn, #30, no refills Rx Ferrous Sulfate 325 1 po BID, #60, 1 refill. To push fluids, incorporate Fe rich foods into diet. Anemia precautions reviewed--patient to call if any s/s of syncope, SOB, severe HA, or profound fatigue occur.  She understands those sx may demonstrate she needs a blood transfusion. Patient instructed to start OCPs previously Rx'd by [facility]--to start today or tomorrow, with precautions reviewed. To f/u with [facility] in 4-6 weeks for recheck of status, or prn.  Nigel Bridgeman, CNM 06/06/12 5:45P

## 2012-06-06 NOTE — Progress Notes (Signed)
Tina Hawkins is a 27 y.o. year old female. She presents to maternity admissions because of heavy bleeding and cramping. That has now resolved.  Subjective:  The patient says that her cramping is much better. The patient reports a past history of heavy and irregular bleeding. She was given birth control pills by the health department but she has not started taking them yet.  Objective:  BP 126/77  Pulse 78  Temp(Src) 97.9 F (36.6 C) (Oral)  Resp 18  SpO2 100%   CBC    Component Value Date/Time   WBC 9.3 06/06/2012 1430   RBC 2.90* 06/06/2012 1430   HGB 8.7* 06/06/2012 1430   HCT 25.2* 06/06/2012 1430   PLT 229 06/06/2012 1430   MCV 86.9 06/06/2012 1430   MCH 30.0 06/06/2012 1430   MCHC 34.5 06/06/2012 1430   RDW 12.2 06/06/2012 1430   LYMPHSABS 1.9 06/06/2012 1430   MONOABS 0.4 06/06/2012 1430   EOSABS 0.1 06/06/2012 1430   BASOSABS 0.0 06/06/2012 1430    Chest: Clear Heart: Regular rate and rhythm, increased rate Abdomen: Soft and nontender  Pelvic exam: See nurse midwife note. Her bleeding has now resolved.  Assessment:  Menorrhagia  Dysmenorrhea  Anemia  Plan:  Check orthostatic blood pressures and pulses.  Ambulate in her room.  If the patient is not orthostatic, and she is able to annular without difficulty, then we will allow the patient to home. She will take iron twice each day for 6 weeks. The patient will begin birth control pills as recommended by a prior health care providers. She will follow up in office in 6 weeks.  If the patient is orthostatic or is not able to ambulate without significant dizziness, then she will need a blood transfusion. The risk and benefits of transfusion were reviewed with the patient.  Leonard Schwartz M.D.  @today @ 4:50 PM

## 2012-06-06 NOTE — MAU Note (Signed)
"  I started bleeding last Friday.  I am still bleeding.  I was having a little light pain all day.  I was at work and lifted a tray of dressing.  After I put the tray down, the pain got worse.  I took two ibuprofen and then started hurting worse.  I started vomiting about 5-10 mins after the pain got worse.  I called the ambulance to come get me."

## 2013-06-07 ENCOUNTER — Emergency Department (HOSPITAL_COMMUNITY)
Admission: EM | Admit: 2013-06-07 | Discharge: 2013-06-07 | Disposition: A | Discharge status: Home / Self Care | Payer: Self-pay | Source: Home / Self Care | Attending: Family Medicine | Admitting: Family Medicine

## 2013-06-07 ENCOUNTER — Other Ambulatory Visit (HOSPITAL_COMMUNITY)
Admission: RE | Admit: 2013-06-07 | Discharge: 2013-06-07 | Disposition: A | Discharge status: Home / Self Care | Payer: Self-pay | Source: Ambulatory Visit | Attending: Family Medicine | Admitting: Family Medicine

## 2013-06-07 ENCOUNTER — Encounter (HOSPITAL_COMMUNITY): Payer: Self-pay | Admitting: Emergency Medicine

## 2013-06-07 DIAGNOSIS — B9689 Other specified bacterial agents as the cause of diseases classified elsewhere: Secondary | ICD-10-CM

## 2013-06-07 DIAGNOSIS — Z113 Encounter for screening for infections with a predominantly sexual mode of transmission: Secondary | ICD-10-CM | POA: Insufficient documentation

## 2013-06-07 DIAGNOSIS — N760 Acute vaginitis: Secondary | ICD-10-CM | POA: Insufficient documentation

## 2013-06-07 DIAGNOSIS — A499 Bacterial infection, unspecified: Secondary | ICD-10-CM

## 2013-06-07 LAB — POCT URINALYSIS DIP (DEVICE)
BILIRUBIN URINE: NEGATIVE
GLUCOSE, UA: NEGATIVE mg/dL
Hgb urine dipstick: NEGATIVE
Ketones, ur: NEGATIVE mg/dL
LEUKOCYTES UA: NEGATIVE
NITRITE: NEGATIVE
Protein, ur: NEGATIVE mg/dL
Specific Gravity, Urine: >=1.03 (ref 1.005–1.030)
UROBILINOGEN UA: 0.2 mg/dL (ref 0.0–1.0)
pH: 5.5 (ref 5.0–8.0)

## 2013-06-07 LAB — POCT PREGNANCY, URINE: PREG TEST UR: NEGATIVE

## 2013-06-07 MED ORDER — METRONIDAZOLE 500 MG PO TABS: 500.0000 mg | ORAL_TABLET | Freq: Two times a day (BID) | ORAL | Status: DC

## 2013-06-07 MED ORDER — METRONIDAZOLE 0.75 % VA GEL: 1.0000 | Freq: Every day | VAGINAL | Status: DC

## 2013-06-07 MED ORDER — FLUCONAZOLE 150 MG PO TABS: 150.0000 mg | ORAL_TABLET | Freq: Once | ORAL | Status: DC

## 2013-06-07 NOTE — ED Notes (Signed)
C/o 1 month duration of vaginal d/c, not responding to home treatment w OTC medications

## 2013-06-07 NOTE — ED Provider Notes (Signed)
CSN: [id number]     Arrival date & time 06/07/13  1850 History   First MD Initiated Contact with Patient 06/07/13 2009     Chief Complaint  Patient presents with  . Vaginal Discharge   (Consider location/radiation/quality/duration/timing/severity/associated sxs/prior Treatment) Patient is a 28 y.o. female presenting with vaginal discharge. The history is provided by the patient.  Vaginal Discharge Quality:  White, thick and malodorous Severity:  Mild Onset quality:  Gradual Duration:  3 weeks Progression:  Unchanged Chronicity:  New Context comment:  Self treated with otc yeast med without relief. Ineffective treatments:  OTC medications Associated symptoms: no abdominal pain, no fever, no genital lesions and no vaginal itching   Risk factors: unprotected sex   Risk factors: no STI and no STI exposure     Past Medical History  Diagnosis Date  . Sickle cell trait   . Candidiasis   . Yeast infection   . Bacterial infection   . Abscess of thigh 06/13/11  . Hepatitis B     vaccines x3  . Trichomonas 2009    treated  . H/O cystitis   . Asthma     as a child  . H/O herpes simplex infection    Past Surgical History  Procedure Laterality Date  . No past surgeries     Family History  Problem Relation Age of Onset  . Heart disease Mother   . Stroke Mother   . Hypertension Mother   . Cancer Maternal Aunt   . Diabetes Maternal Aunt   . Hypertension Maternal Aunt   . Anesthesia problems Neg Hx   . Heart disease Maternal Grandmother   . Heart disease Maternal Grandfather   . Hypertension Cousin   . Kidney disease Cousin    History  Substance Use Topics  . Smoking status: Former Games developer  . Smokeless tobacco: Not on file  . Alcohol Use: No   OB History   Grav Para Term Preterm Abortions TAB SAB Ect Mult Living   4 1 1  3 1 2   1      Review of Systems  Constitutional: Negative.  Negative for fever.  Gastrointestinal: Negative for abdominal pain.  Genitourinary:  Positive for vaginal discharge. Negative for vaginal bleeding and menstrual problem.    Allergies  Dust mite extract  Home Medications   Current Outpatient Rx  Name  Route  Sig  Dispense  Refill  . ferrous sulfate (FERROUSUL) 325 (65 FE) MG tablet   Oral   Take 1 tablet (325 mg total) by mouth 2 (two) times daily.   60 tablet   2   . fluconazole (DIFLUCAN) 150 MG tablet   Oral   Take 1 tablet (150 mg total) by mouth once. May refill in 1 week.   1 tablet   1   . HYDROcodone-acetaminophen (NORCO/VICODIN) 5-325 MG per tablet   Oral   Take 1 tablet by mouth every 6 (six) hours as needed for pain.   30 tablet   0   . IBUPROFEN PO   Oral   Take 2 tablets by mouth as needed (cramps).         . metroNIDAZOLE (FLAGYL) 500 MG tablet   Oral   Take 1 tablet (500 mg total) by mouth 2 (two) times daily.   14 tablet   0   . metroNIDAZOLE (METROGEL VAGINAL) 0.75 % vaginal gel   Vaginal   Place 1 Applicatorful vaginally at bedtime. For 5 nights   70 g  1    BP 129/75  Pulse 69  Temp(Src) 98.7 F (37.1 C) (Oral)  Resp 16  SpO2 100% Physical Exam  Nursing note and vitals reviewed. Constitutional: She is oriented to person, place, and time. She appears well-developed and well-nourished.  Abdominal: Soft. Bowel sounds are normal.  Genitourinary: Uterus normal. Uterus is not tender. Cervix exhibits no motion tenderness, no discharge and no friability. Right adnexum displays no tenderness. Left adnexum displays no tenderness. No erythema, tenderness or bleeding around the vagina. No foreign body around the vagina. Vaginal discharge found.  Neurological: She is alert and oriented to person, place, and time.  Skin: Skin is warm and dry.    ED Course  Procedures (including critical care time) Labs Review Labs Reviewed  POCT URINALYSIS DIP (DEVICE)  POCT PREGNANCY, URINE  CERVICOVAGINAL ANCILLARY ONLY   Imaging Review No results found.   MDM   1. BV (bacterial  vaginosis)        Linna HoffJames D Ruthie Berch, MD 06/07/13 2037

## 2013-06-07 NOTE — Discharge Instructions (Signed)
We will call with positive test results and treat as indicated  °

## 2013-06-08 LAB — CERVICOVAGINAL ANCILLARY ONLY
Chlamydia: NEGATIVE
Neisseria Gonorrhea: NEGATIVE
Wet Prep (BD Affirm): NEGATIVE
Wet Prep (BD Affirm): NEGATIVE
Wet Prep (BD Affirm): POSITIVE — AB

## 2013-06-08 NOTE — ED Notes (Signed)
GC/Chlamydia neg., Affirm: Candida and Trich neg., Gardnerella pos.  Pt. adequately treated with Flagyl. Vassie MoselleYork, Deltha Bernales M 06/08/2013

## 2014-01-17 ENCOUNTER — Encounter (HOSPITAL_COMMUNITY): Payer: Self-pay | Admitting: Emergency Medicine

## 2014-07-31 IMAGING — US US TRANSVAGINAL NON-OB
1 series · 14 of 25 positions shown · non-contrast
Comparison: None.

CLINICAL DATA: Missed abortion.  Negative urine pregnancy test.

TRANSABDOMINAL ULTRASOUND OF PELVIS
TECHNIQUE: Transabdominal ultrasound examination of the pelvis was
performed including evaluation of the uterus, ovaries, adnexal
regions, and pelvic cul-de-sac.

[Series 1: us pelvis complete · 14 of 38 slices shown]
[im 1/38]
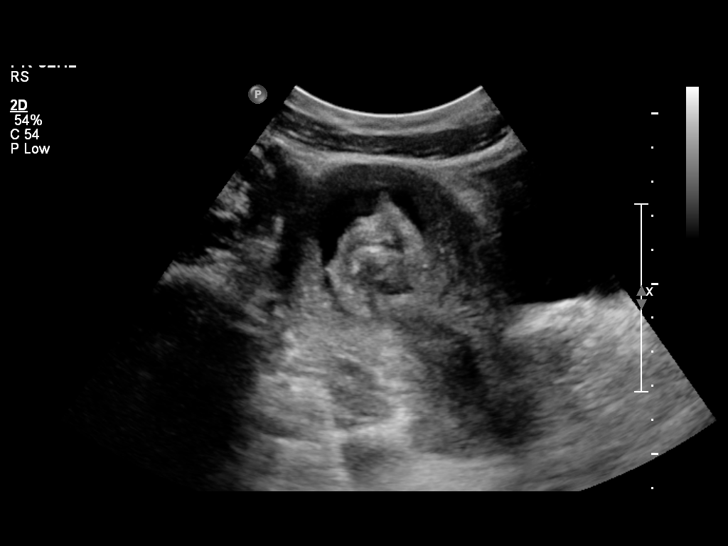
[im 4/38]
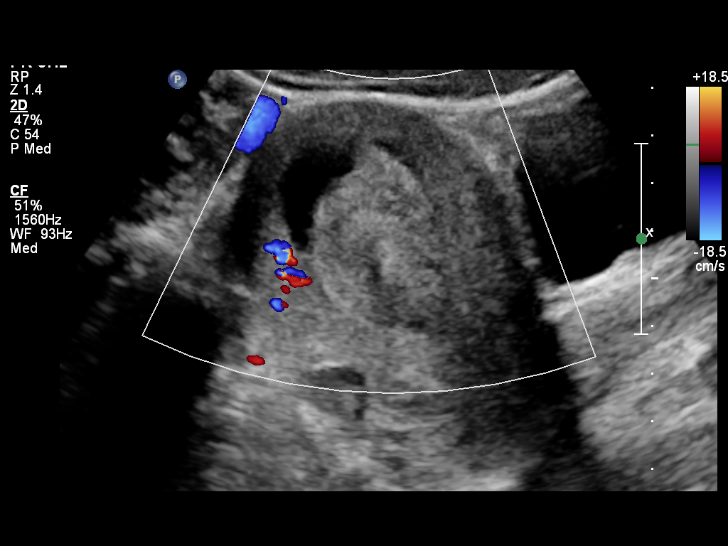
[im 7/38]
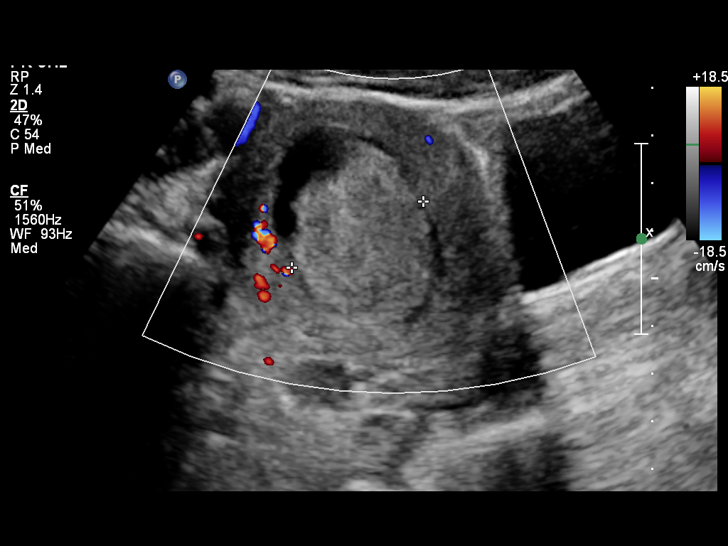
[im 10/38]
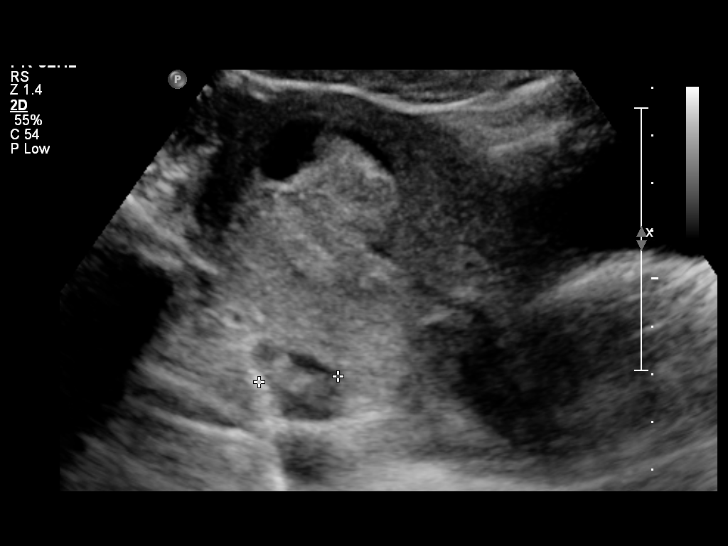
[im 13/38]
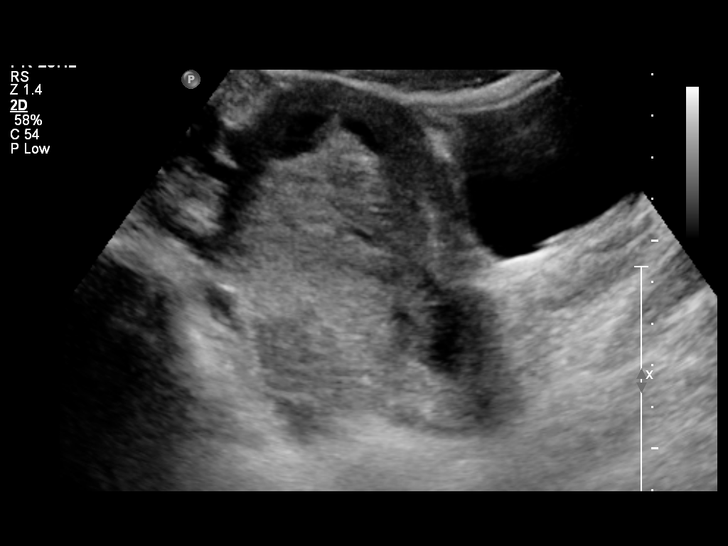
[im 14/38]
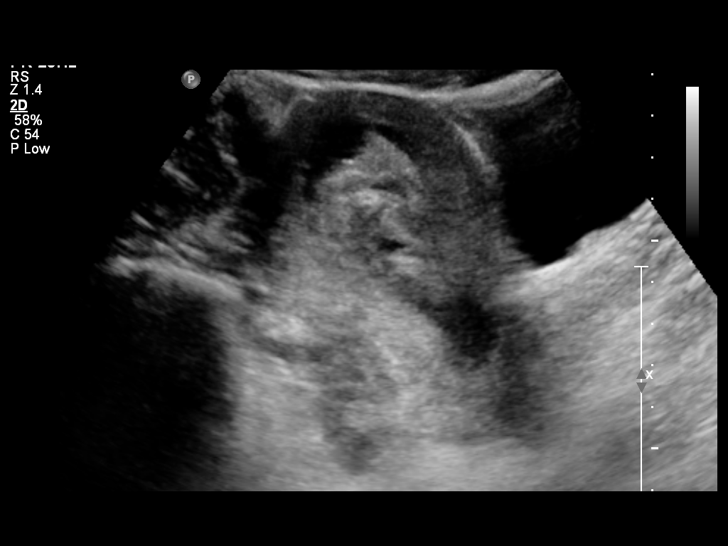
[im 17/38]
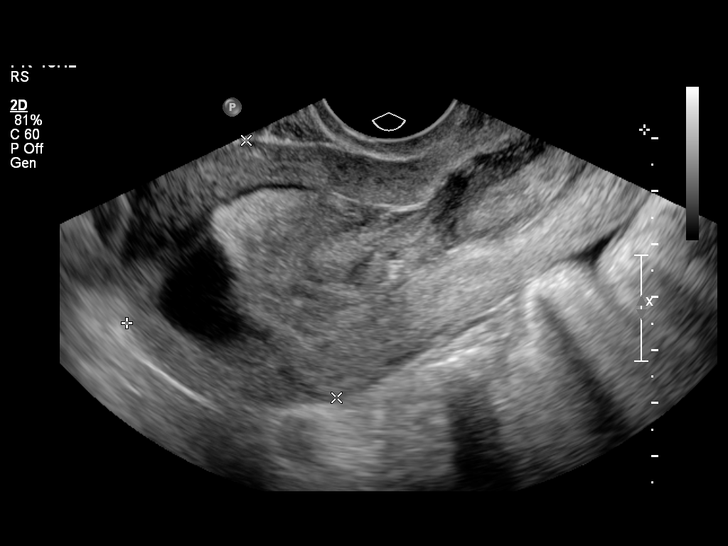
[im 21/38]
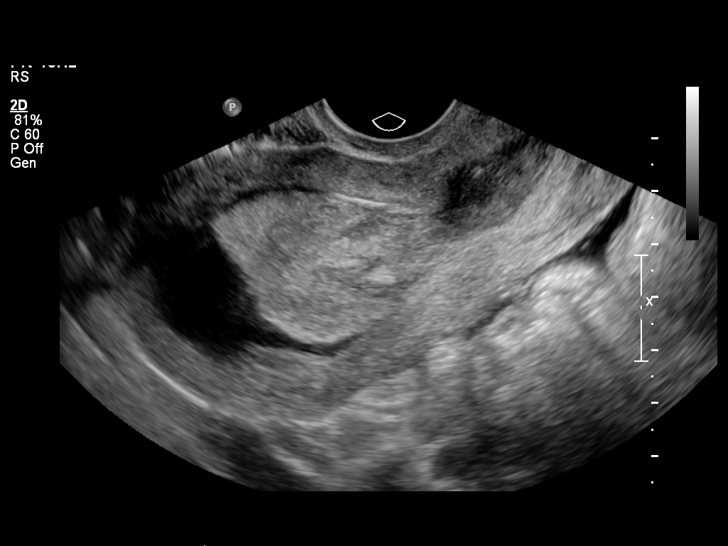
[im 24/38]
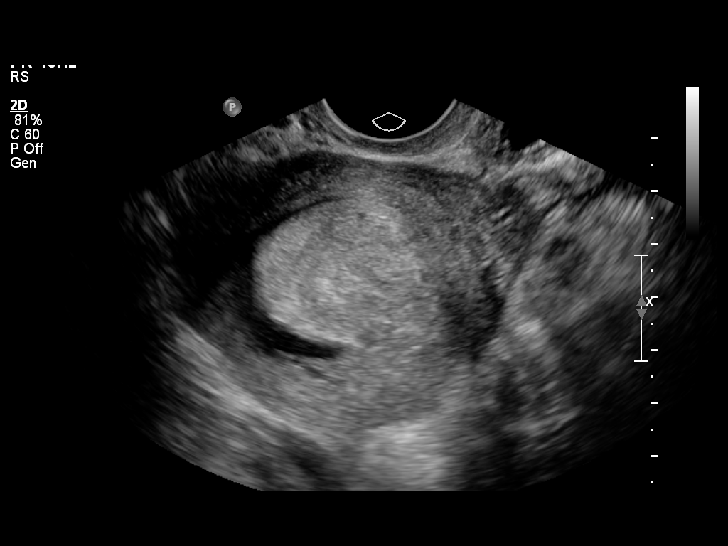
[im 25/38]
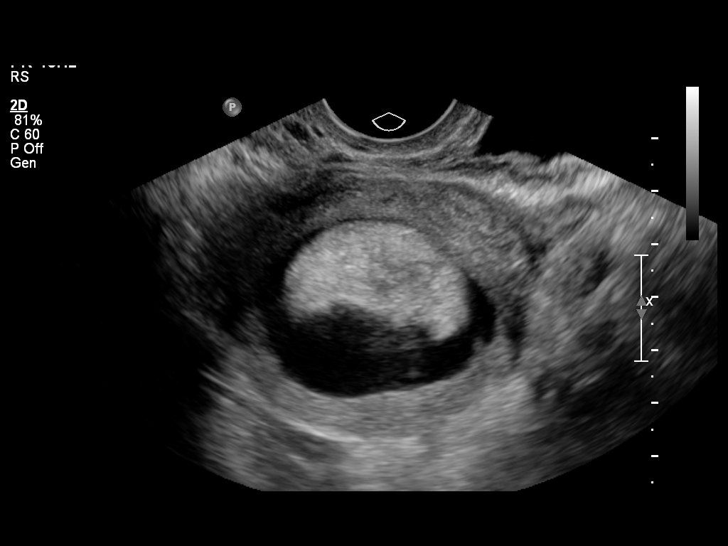
[im 28/38]
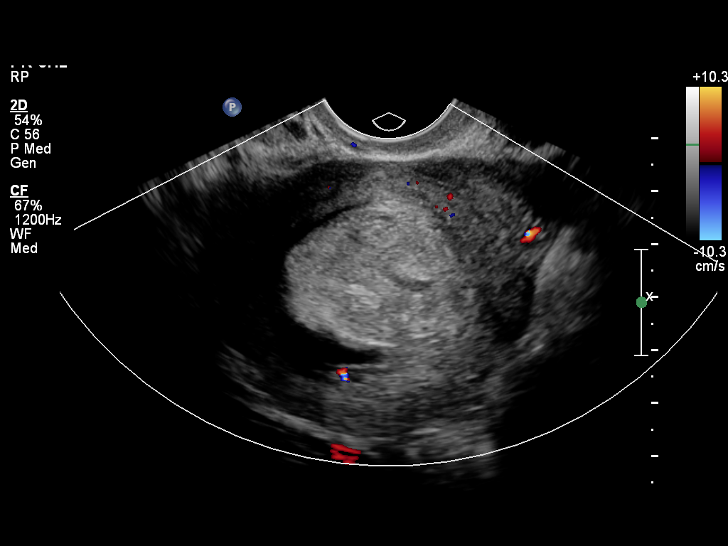
[im 31/38]
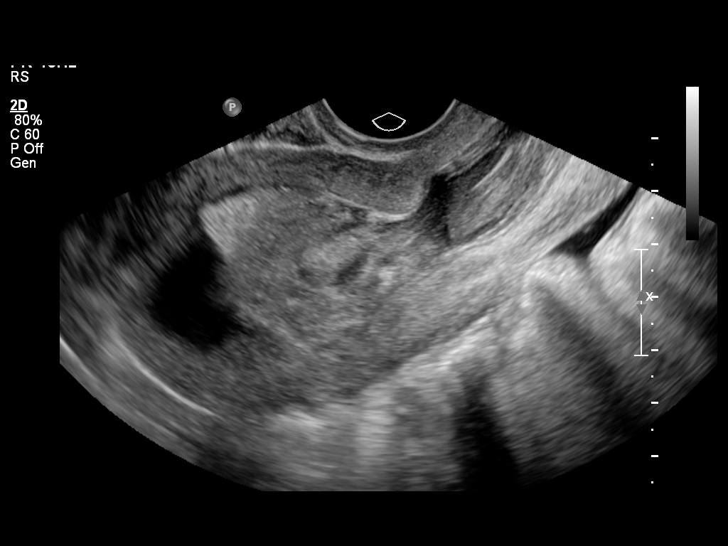
[im 34/38]
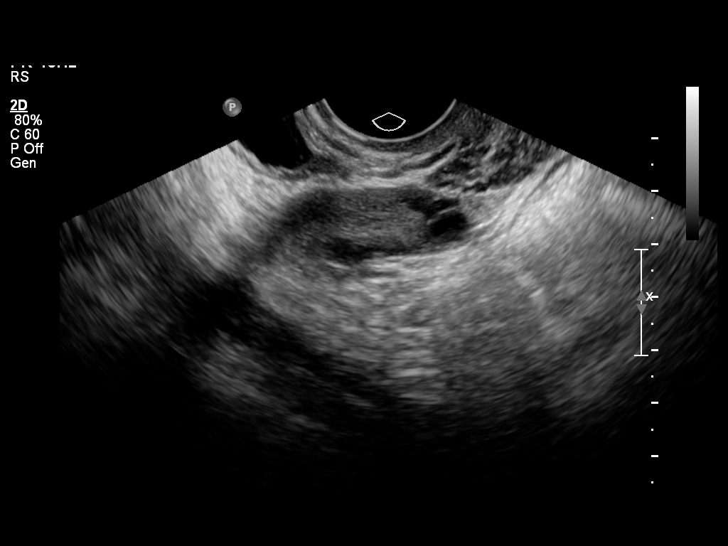
[im 38/38]
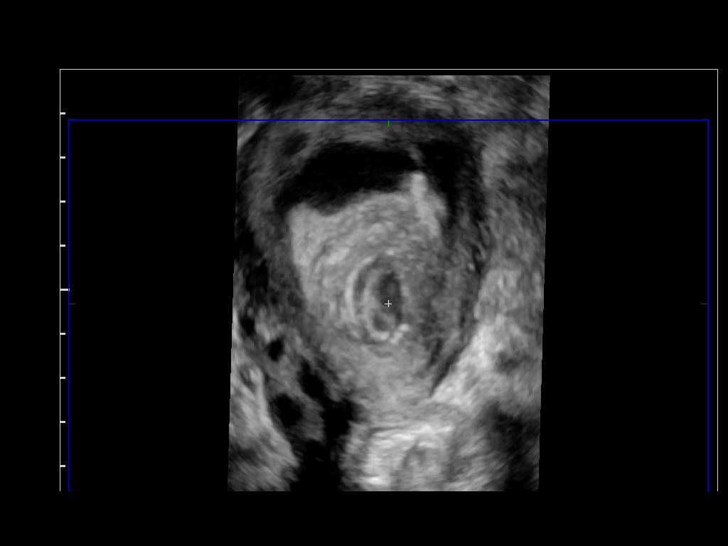

[14 of 25 positions shown; findings below may reference images not displayed]

FINDINGS: Uterus:  Normal in size at 10.4 x 5.2 by 5.8 cm.

Endometrium: There is a densely echogenic relatively homogeneous
mass within the endometrial canal the measuring 33 x 30 mm.  There
is no clear vascularity associated with this rounded echogenic
mass. There is anechoic fluid within the canal additionally.  Favor
this to represent a large hematoma.  Endometrial canal measures
approximate 33 mm.

Right ovary: Normal size at 4.4 x 1.6 x 1.5 cm.  Small follicles.

Left ovary: Normal size measuring 3.5 x 1.3 x 3.6 cm.  Small
follicles.

Other Findings:  No free fluid
IMPRESSION: Large relatively homogeneous echogenic mass within the endometrial
canal measuring 3.3 x 3.0 cm with [REDACTED] small fluid.  Favor
this to represent a large hematoma.  No significant vascularity to
suggest retained products of conception.

## 2015-07-01 ENCOUNTER — Encounter (HOSPITAL_COMMUNITY): Payer: Self-pay | Admitting: Emergency Medicine

## 2015-07-01 ENCOUNTER — Emergency Department (HOSPITAL_COMMUNITY)
Admission: EM | Admit: 2015-07-01 | Discharge: 2015-07-01 | Disposition: A | Discharge status: Home / Self Care | Payer: Self-pay | Attending: Emergency Medicine | Admitting: Emergency Medicine

## 2015-07-01 DIAGNOSIS — R42 Dizziness and giddiness: Secondary | ICD-10-CM | POA: Insufficient documentation

## 2015-07-01 DIAGNOSIS — J45909 Unspecified asthma, uncomplicated: Secondary | ICD-10-CM | POA: Insufficient documentation

## 2015-07-01 DIAGNOSIS — Z8619 Personal history of other infectious and parasitic diseases: Secondary | ICD-10-CM | POA: Insufficient documentation

## 2015-07-01 DIAGNOSIS — Z87448 Personal history of other diseases of urinary system: Secondary | ICD-10-CM | POA: Insufficient documentation

## 2015-07-01 DIAGNOSIS — F17200 Nicotine dependence, unspecified, uncomplicated: Secondary | ICD-10-CM | POA: Insufficient documentation

## 2015-07-01 DIAGNOSIS — Z3202 Encounter for pregnancy test, result negative: Secondary | ICD-10-CM | POA: Insufficient documentation

## 2015-07-01 DIAGNOSIS — Z862 Personal history of diseases of the blood and blood-forming organs and certain disorders involving the immune mechanism: Secondary | ICD-10-CM | POA: Insufficient documentation

## 2015-07-01 DIAGNOSIS — Z79899 Other long term (current) drug therapy: Secondary | ICD-10-CM | POA: Insufficient documentation

## 2015-07-01 DIAGNOSIS — Z792 Long term (current) use of antibiotics: Secondary | ICD-10-CM | POA: Insufficient documentation

## 2015-07-01 LAB — I-STAT CHEM 8, ED
BUN: 11 mg/dL (ref 6–20)
CHLORIDE: 105 mmol/L (ref 101–111)
CREATININE: 0.9 mg/dL (ref 0.44–1.00)
Calcium, Ion: 1.15 mmol/L (ref 1.12–1.23)
GLUCOSE: 91 mg/dL (ref 65–99)
HCT: 41 % (ref 36.0–46.0)
Hemoglobin: 13.9 g/dL (ref 12.0–15.0)
POTASSIUM: 3.4 mmol/L — AB (ref 3.5–5.1)
Sodium: 139 mmol/L (ref 135–145)
TCO2: 22 mmol/L (ref 0–100)

## 2015-07-01 LAB — I-STAT BETA HCG BLOOD, ED (MC, WL, AP ONLY): I-stat hCG, quantitative: <5 m[IU]/mL (ref <5)

## 2015-07-01 MED ORDER — SODIUM CHLORIDE 0.9 % IV BOLUS (SEPSIS)
1000.0000 mL | Freq: Once | INTRAVENOUS | Status: AC
  Administered 2015-07-01: 1000 mL via INTRAVENOUS

## 2015-07-01 MED ORDER — ONDANSETRON HCL 4 MG PO TABS: 4.0000 mg | ORAL_TABLET | Freq: Three times a day (TID) | ORAL | Status: DC | PRN

## 2015-07-01 MED ORDER — DIAZEPAM 2 MG PO TABS
2.0000 mg | ORAL_TABLET | Freq: Once | ORAL | Status: AC
  Administered 2015-07-01: 2 mg via ORAL
  Filled 2015-07-01: qty 1

## 2015-07-01 MED ORDER — ONDANSETRON HCL 4 MG/2ML IJ SOLN
4.0000 mg | Freq: Once | INTRAMUSCULAR | Status: AC
  Administered 2015-07-01: 4 mg via INTRAVENOUS
  Filled 2015-07-01: qty 2

## 2015-07-01 MED ORDER — MECLIZINE HCL 25 MG PO TABS
25.0000 mg | ORAL_TABLET | Freq: Once | ORAL | Status: AC
  Administered 2015-07-01: 25 mg via ORAL
  Filled 2015-07-01: qty 1

## 2015-07-01 MED ORDER — MECLIZINE HCL 25 MG PO TABS: 25.0000 mg | ORAL_TABLET | Freq: Three times a day (TID) | ORAL | Status: DC | PRN

## 2015-07-01 NOTE — ED Notes (Signed)
Patient able to ambulate independently  

## 2015-07-01 NOTE — Discharge Instructions (Signed)

## 2015-07-01 NOTE — ED Notes (Signed)
Pt unable to ambulate at this time due to dizziness. Pt stood with RN, but unable to take a few steps

## 2015-07-01 NOTE — ED Notes (Signed)
Pt states she has been feeling dizzy since last night 1030pm. States the room is spinning. Pt states she has been nauseous as well.

## 2015-07-01 NOTE — ED Provider Notes (Signed)
CSN: [id number]     Arrival date & time 07/01/15  1034 History   First MD Initiated Contact with Patient 07/01/15 1118     Chief Complaint  Patient presents with  . Dizziness     Patient is a 30 y.o. female presenting with dizziness. The history is provided by the patient. No language interpreter was used.  Dizziness  Tina Hawkins is a 30 y.o. female who presents to the Emergency Department complaining of dizziness.  Sxs started at 1030pm last night.  She has dizziness/lightheadedness described as a spinning sensation.  This morning she developed nausea/vomiting.  She endorses poor/little sleep recently.  No fevers or pain.  Symptoms are worse with sitting up and ambulating but they are constant nature and they persist even when she is sitting or supine. No prior similar symptoms. She does have mild photophobia. She has generalized weakness but no focal weakness, no numbness, abdominal pain, diarrhea. No recent injuries. No sick contacts.  Past Medical History  Diagnosis Date  . Sickle cell trait (HCC)   . Candidiasis   . Yeast infection   . Bacterial infection   . Abscess of thigh 06/13/11  . Hepatitis B     vaccines x3  . Trichomonas 2009    treated  . H/O cystitis   . Asthma     as a child  . H/O herpes simplex infection    Past Surgical History  Procedure Laterality Date  . No past surgeries     Family History  Problem Relation Age of Onset  . Heart disease Mother   . Stroke Mother   . Hypertension Mother   . Cancer Maternal Aunt   . Diabetes Maternal Aunt   . Hypertension Maternal Aunt   . Anesthesia problems Neg Hx   . Heart disease Maternal Grandmother   . Heart disease Maternal Grandfather   . Hypertension Cousin   . Kidney disease Cousin    Social History  Substance Use Topics  . Smoking status: Current Every Day Smoker -- 0.50 packs/day  . Smokeless tobacco: None  . Alcohol Use: Yes     Comment: occasionally   OB History    Gravida Para Term Preterm  AB TAB SAB Ectopic Multiple Living   4 1 1  3 1 2   1      Review of Systems  Neurological: Positive for dizziness.  All other systems reviewed and are negative.     Allergies  Dust mite extract  Home Medications   Prior to Admission medications   Medication Sig Start Date End Date Taking? Authorizing Provider  IBUPROFEN PO Take 2 tablets by mouth as needed (cramps).   Yes Historical Provider, MD  ferrous sulfate (FERROUSUL) 325 (65 FE) MG tablet Take 1 tablet (325 mg total) by mouth 2 (two) times daily. 06/06/12   Nigel BridgemanVicki Latham, CNM  fluconazole (DIFLUCAN) 150 MG tablet Take 1 tablet (150 mg total) by mouth once. May refill in 1 week. 06/07/13   Linna HoffJames D Kindl, MD  HYDROcodone-acetaminophen (NORCO/VICODIN) 5-325 MG per tablet Take 1 tablet by mouth every 6 (six) hours as needed for pain. 06/06/12   Nigel BridgemanVicki Latham, CNM  meclizine (ANTIVERT) 25 MG tablet Take 1 tablet (25 mg total) by mouth 3 (three) times daily as needed for dizziness. 07/01/15   Tilden FossaElizabeth Alecsander Hattabaugh, MD  metroNIDAZOLE (FLAGYL) 500 MG tablet Take 1 tablet (500 mg total) by mouth 2 (two) times daily. 06/07/13   Linna HoffJames D Kindl, MD  metroNIDAZOLE (METROGEL  VAGINAL) 0.75 % vaginal gel Place 1 Applicatorful vaginally at bedtime. For 5 nights 06/07/13   Linna Hoff, MD  ondansetron (ZOFRAN) 4 MG tablet Take 1 tablet (4 mg total) by mouth every 8 (eight) hours as needed for nausea or vomiting. 07/01/15   Tilden Fossa, MD   BP 102/68 mmHg  Pulse 88  Temp(Src) 97.7 F (36.5 C) (Oral)  Resp 19  Ht  (1.626 m)  Wt 150 lb (68.04 kg)  BMI 25.73 kg/m2  SpO2 99%  LMP 06/09/2015 Physical Exam  Constitutional: She is oriented to person, place, and time. She appears well-developed and well-nourished.  HENT:  Head: Normocephalic and atraumatic.  TMs clear bilaterally  Eyes: Pupils are equal, round, and reactive to light.  Nystagmus  Neck: Neck supple.  Cardiovascular: Normal rate and regular rhythm.   No murmur  heard. Pulmonary/Chest: Effort normal and breath sounds normal. No respiratory distress.  Abdominal: Soft. There is no tenderness. There is no rebound and no guarding.  Musculoskeletal: She exhibits no edema or tenderness.  Neurological: She is alert and oriented to person, place, and time. No cranial nerve deficit.  5 out of 5 strength in all 4 extremities and sensation of light touch intact in all 4 extremities. No pronator drift. No ataxia on finger-nose-finger bilaterally.  Skin: Skin is warm and dry.  Psychiatric: She has a normal mood and affect. Her behavior is normal.  Nursing note and vitals reviewed.   ED Course  Procedures (including critical care time) Labs Review Labs Reviewed  I-STAT CHEM 8, ED - Abnormal; Notable for the following:    Potassium 3.4 (*)    All other components within normal limits  I-STAT BETA HCG BLOOD, ED ([facility], [facility], [facility] ONLY)    Imaging Review No results found. I have personally reviewed and evaluated these images and lab results as part of my medical decision-making.   EKG Interpretation   Date/Time:  Saturday July 01 2015 10:44:21 EDT Ventricular Rate:  79 PR Interval:  134 QRS Duration: 84 QT Interval:  393 QTC Calculation: 450 R Axis:   69 Text Interpretation:  Sinus rhythm Confirmed by Lincoln Brigham [phone]) on  07/01/2015 11:59:14 AM      MDM   Final diagnoses:  Vertigo  Patient here for evaluation of vertigo with associated vomiting. She has no focal neurologic deficits on examination. No evidence of acute otitis media. Presentation is not consistent with CVA. Discussed the patient home care for vertigo, outpatient follow-up, return precautions. Her symptoms did significantly improve in the department following medications and she is able to drink and ambulate.    Tilden Fossa, MD 07/01/15 [phone]

## 2015-11-11 ENCOUNTER — Emergency Department (HOSPITAL_COMMUNITY): Payer: Self-pay

## 2015-11-11 ENCOUNTER — Emergency Department (HOSPITAL_COMMUNITY)
Admission: EM | Admit: 2015-11-11 | Discharge: 2015-11-11 | Disposition: A | Discharge status: Home / Self Care | Payer: Self-pay | Attending: Emergency Medicine | Admitting: Emergency Medicine

## 2015-11-11 ENCOUNTER — Encounter (HOSPITAL_COMMUNITY): Payer: Self-pay | Admitting: Emergency Medicine

## 2015-11-11 DIAGNOSIS — O039 Complete or unspecified spontaneous abortion without complication: Secondary | ICD-10-CM | POA: Insufficient documentation

## 2015-11-11 DIAGNOSIS — F17200 Nicotine dependence, unspecified, uncomplicated: Secondary | ICD-10-CM | POA: Insufficient documentation

## 2015-11-11 DIAGNOSIS — O200 Threatened abortion: Secondary | ICD-10-CM

## 2015-11-11 DIAGNOSIS — O0281 Inappropriate change in quantitative human chorionic gonadotropin (hCG) in early pregnancy: Secondary | ICD-10-CM | POA: Insufficient documentation

## 2015-11-11 DIAGNOSIS — Z79899 Other long term (current) drug therapy: Secondary | ICD-10-CM | POA: Insufficient documentation

## 2015-11-11 DIAGNOSIS — J45909 Unspecified asthma, uncomplicated: Secondary | ICD-10-CM | POA: Insufficient documentation

## 2015-11-11 DIAGNOSIS — N939 Abnormal uterine and vaginal bleeding, unspecified: Secondary | ICD-10-CM

## 2015-11-11 LAB — BASIC METABOLIC PANEL
ANION GAP: 5 (ref 5–15)
BUN: 8 mg/dL (ref 6–20)
CHLORIDE: 107 mmol/L (ref 101–111)
CO2: 23 mmol/L (ref 22–32)
Calcium: 9.5 mg/dL (ref 8.9–10.3)
Creatinine, Ser: 0.77 mg/dL (ref 0.44–1.00)
Glucose, Bld: 109 mg/dL — ABNORMAL HIGH (ref 65–99)
POTASSIUM: 3.3 mmol/L — AB (ref 3.5–5.1)
SODIUM: 135 mmol/L (ref 135–145)

## 2015-11-11 LAB — WET PREP, GENITAL
Sperm: NONE SEEN
Trich, Wet Prep: NONE SEEN
YEAST WET PREP: NONE SEEN

## 2015-11-11 LAB — CBC
HCT: 33.1 % — ABNORMAL LOW (ref 36.0–46.0)
HEMOGLOBIN: 11.5 g/dL — AB (ref 12.0–15.0)
MCH: 29.8 pg (ref 26.0–34.0)
MCHC: 34.7 g/dL (ref 30.0–36.0)
MCV: 85.8 fL (ref 78.0–100.0)
PLATELETS: 193 10*3/uL (ref 150–400)
RBC: 3.86 MIL/uL — AB (ref 3.87–5.11)
RDW: 12.1 % (ref 11.5–15.5)
WBC: 7.5 10*3/uL (ref 4.0–10.5)

## 2015-11-11 LAB — URINALYSIS, ROUTINE W REFLEX MICROSCOPIC
Bilirubin Urine: NEGATIVE
Glucose, UA: NEGATIVE mg/dL
Ketones, ur: NEGATIVE mg/dL
LEUKOCYTES UA: NEGATIVE
NITRITE: NEGATIVE
PH: 6 (ref 5.0–8.0)
Protein, ur: NEGATIVE mg/dL
SPECIFIC GRAVITY, URINE: 1.018 (ref 1.005–1.030)

## 2015-11-11 LAB — I-STAT BETA HCG BLOOD, ED (MC, WL, AP ONLY)

## 2015-11-11 LAB — URINE MICROSCOPIC-ADD ON

## 2015-11-11 LAB — ABO/RH: ABO/RH(D): O POS

## 2015-11-11 LAB — HCG, QUANTITATIVE, PREGNANCY: hCG, Beta Chain, Quant, S: 6093 m[IU]/mL — ABNORMAL HIGH (ref <5)

## 2015-11-11 NOTE — ED Triage Notes (Signed)
Pt reports vaginal bleeding x 2.5 weeks worsening last night.  Pt reports she may be pregnant and can't remember when her last period was.  Pt in NAD, A&O.

## 2015-11-11 NOTE — ED Provider Notes (Signed)
[facility]-EMERGENCY DEPT Provider Note   CSN: [id number] Arrival date & time: 11/11/15  1052     History   Chief Complaint Chief Complaint  Patient presents with  . Vaginal Bleeding    HPI Tina Hawkins is a 30 y.o. female.G3 P1 A2 presents today stating last menstrual period was 2-2 and half months ago. She states she has normal menses. She has been sexually active and is not using birth control. She has felt that she was likely pregnant with symptoms of breast tenderness and nausea. Over the past week she has had some dark discolored discharge. Last night she began having some heavier cramping and had bright red blood areas bleeding has decreased back to spotting. She continues to have some crampy lower abdominal discomfort. She denies any headedness or weakness. She denies any lateralized pelvic pain. The pain has essentially stopped since last night. She has been sexually active with only 1 partner for the past 3 years. She does complain of some odor in the vaginal area. She states that she has had herpes but denies any history of gonorrhea or chlamydia. She has not noted any recent herpes outbreak. She has not had any regular care and does not think she has had a Pap smear since her baby was born approximately 4 years ago.  HPI  Past Medical History:  Diagnosis Date  . Abscess of thigh 06/13/11  . Asthma    as a child  . Bacterial infection   . Candidiasis   . H/O cystitis   . H/O herpes simplex infection   . Hepatitis B    vaccines x3  . Sickle cell trait (HCC)   . Trichomonas 2009   treated  . Yeast infection     Patient Active Problem List   Diagnosis Date Noted  . 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 Sickle cell trait (HCC) 06/01/2011    Past Surgical History:  Procedure Laterality Date  . NO PAST SURGERIES      OB History    Gravida Para Term Preterm AB Living   4 1 1   3 1    SAB TAB Ectopic Multiple Live Births   2 1     1        Home  Medications    Prior to Admission medications   Medication Sig Start Date End Date Taking? Authorizing Provider  ferrous sulfate (FERROUSUL) 325 (65 FE) MG tablet Take 1 tablet (325 mg total) by mouth 2 (two) times daily. 06/06/12   Nigel Bridgeman, CNM  fluconazole (DIFLUCAN) 150 MG tablet Take 1 tablet (150 mg total) by mouth once. May refill in 1 week. 06/07/13   Linna Hoff, MD  HYDROcodone-acetaminophen (NORCO/VICODIN) 5-325 MG per tablet Take 1 tablet by mouth every 6 (six) hours as needed for pain. 06/06/12   Nigel Bridgeman, CNM  IBUPROFEN PO Take 2 tablets by mouth as needed (cramps).    Historical Provider, MD  meclizine (ANTIVERT) 25 MG tablet Take 1 tablet (25 mg total) by mouth 3 (three) times daily as needed for dizziness. 07/01/15   Tilden Fossa, MD  metroNIDAZOLE (FLAGYL) 500 MG tablet Take 1 tablet (500 mg total) by mouth 2 (two) times daily. 06/07/13   Linna Hoff, MD  metroNIDAZOLE (METROGEL VAGINAL) 0.75 % vaginal gel Place 1 Applicatorful vaginally at bedtime. For 5 nights 06/07/13   Linna Hoff, MD  ondansetron (ZOFRAN) 4 MG tablet Take 1 tablet (4 mg total) by mouth every 8 (  eight) hours as needed for nausea or vomiting. 07/01/15   Tilden Fossa, MD    Family History Family History  Problem Relation Age of Onset  . Heart disease Maternal Grandmother   . Heart disease Mother   . Stroke Mother   . Hypertension Mother   . Cancer Maternal Aunt   . Diabetes Maternal Aunt   . Hypertension Maternal Aunt   . Heart disease Maternal Grandfather   . Hypertension Cousin   . Kidney disease Cousin   . Anesthesia problems Neg Hx     Social History Social History  Substance Use Topics  . Smoking status: Current Every Day Smoker    Packs/day: 0.50  . Smokeless tobacco: Never Used  . Alcohol use Yes     Comment: occasionally     Allergies   Dust mite extract   Review of Systems Review of Systems  All other systems reviewed and are negative.    Physical  Exam Updated Vital Signs BP 140/81 (BP Location: Left Arm)   Pulse 114   Temp 98 F (36.7 C) (Oral)   Resp 20   SpO2 100%   Physical Exam  Constitutional: She appears well-developed and well-nourished.  HENT:  Head: Normocephalic and atraumatic.  Right Ear: External ear normal.  Left Ear: External ear normal.  Nose: Nose normal.  Mouth/Throat: Oropharynx is clear and moist.  Eyes: Conjunctivae and EOM are normal. Pupils are equal, round, and reactive to light.  Neck: Normal range of motion. Neck supple.  Cardiovascular: Normal rate, regular rhythm and normal heart sounds.   Pulmonary/Chest: Effort normal and breath sounds normal.  Abdominal: Soft. Bowel sounds are normal. She exhibits no distension. There is no tenderness. There is no rebound and no guarding.  Genitourinary: Pelvic exam was performed with patient supine. There is no rash, tenderness or lesion on the right labia. There is no rash, tenderness or lesion on the left labia. Uterus is enlarged and tender. Cervix exhibits motion tenderness. Cervix exhibits no discharge and no friability. Right adnexum displays no mass. Left adnexum displays mass. No signs of injury around the vagina. No vaginal discharge found.  Genitourinary Comments: Some dark blood noted at arms. Os is closed  Nursing note and vitals reviewed.    ED Treatments / Results  Labs (all labs ordered are listed, but only abnormal results are displayed) Labs Reviewed  WET PREP, GENITAL  CBC  BASIC METABOLIC PANEL  URINALYSIS, ROUTINE W REFLEX MICROSCOPIC (NOT AT [facility])  I-STAT BETA HCG BLOOD, ED ([facility], [facility], [facility] ONLY)  GC/CHLAMYDIA PROBE AMP ([place]) NOT AT [facility]    EKG  EKG Interpretation None       Radiology No results found.  Procedures Procedures (including critical care time) EMERGENCY DEPARTMENT [place] PREGNANCY "Study: Limited Ultrasound of the Pelvis for Pregnancy"  INDICATIONS:Vaginal bleeding Multiple views of the uterus and pelvic  cavity were obtained in real-time with a multi-frequency probe.  APPROACH:Transabdominal   PERFORMED BY: Myself  IMAGES ARCHIVED?: Yes  LIMITATIONS: Emergent procedure  PREGNANCY FREE FLUID: None  ADNEXAL FINDINGS:Right ovarion cyst  PREGNANCY FINDINGS: No yolk sac noted and No fetal pole seen  INTERPRETATION: No visualized intrauterine pregnancy and Pelvic free fluid absent  GESTATIONAL AGE, ESTIMATE:na na  FETAL HEART RATE:na    Medications Ordered in ED Medications - No data to display   Initial Impression / Assessment and Plan / ED Course  I have reviewed the triage vital signs and the nursing notes.  Pertinent labs & imaging  results that were available during my care of the patient were reviewed by me and considered in my medical decision making (see chart for details).  Clinical Course    30 year old G4 P1 A2 last menstrual period 2-2 and half months ago presents today complaining of crampy lower abdominal pain with bleeding for the past week. Here her quantitative hCG is 6093, she is Rh+. Ultrasound shows no gestational sac. She has been pain-free here. Discussed patient's care with Dr. Debroah LoopArnold on call for teaching service OB/GYN. Plan recheck in 48 hours. This appears to be miscarriage with low suspicion for ectopic pregnancy She is given return precautions and voices understanding. Final Clinical Impressions(s) / ED Diagnoses   Final diagnoses:  Vaginal bleeding  Threatened miscarriage  Spontaneous miscarriage    New Prescriptions New Prescriptions   No medications on file     Margarita Grizzleanielle Ardith Test, MD 11/11/15 1554

## 2015-11-11 NOTE — ED Notes (Signed)
Pt is in stable condition upon d/c and ambulates from ED. 

## 2015-11-11 NOTE — Discharge Instructions (Signed)
[facility] for recheck quantitative hcg to make sure it is going lower.  Return if severe bleeding, worsened pain, or light headed.

## 2015-11-13 LAB — GC/CHLAMYDIA PROBE AMP (~~LOC~~) NOT AT ARMC
Chlamydia: NEGATIVE
Neisseria Gonorrhea: NEGATIVE

## 2015-11-19 ENCOUNTER — Inpatient Hospital Stay (HOSPITAL_COMMUNITY): Payer: Self-pay

## 2015-11-19 ENCOUNTER — Inpatient Hospital Stay (HOSPITAL_COMMUNITY)
Admission: AD | Admit: 2015-11-19 | Discharge: 2015-11-19 | Disposition: A | Discharge status: Home / Self Care | Payer: Self-pay | Source: Ambulatory Visit | Attending: Obstetrics & Gynecology | Admitting: Obstetrics & Gynecology

## 2015-11-19 ENCOUNTER — Encounter (HOSPITAL_COMMUNITY): Payer: Self-pay | Admitting: *Deleted

## 2015-11-19 DIAGNOSIS — O98813 Other maternal infectious and parasitic diseases complicating pregnancy, third trimester: Secondary | ICD-10-CM | POA: Insufficient documentation

## 2015-11-19 DIAGNOSIS — O99011 Anemia complicating pregnancy, first trimester: Secondary | ICD-10-CM | POA: Insufficient documentation

## 2015-11-19 DIAGNOSIS — O99511 Diseases of the respiratory system complicating pregnancy, first trimester: Secondary | ICD-10-CM | POA: Insufficient documentation

## 2015-11-19 DIAGNOSIS — J45909 Unspecified asthma, uncomplicated: Secondary | ICD-10-CM | POA: Insufficient documentation

## 2015-11-19 DIAGNOSIS — Z3A11 11 weeks gestation of pregnancy: Secondary | ICD-10-CM | POA: Insufficient documentation

## 2015-11-19 DIAGNOSIS — D573 Sickle-cell trait: Secondary | ICD-10-CM | POA: Insufficient documentation

## 2015-11-19 DIAGNOSIS — O209 Hemorrhage in early pregnancy, unspecified: Secondary | ICD-10-CM | POA: Insufficient documentation

## 2015-11-19 DIAGNOSIS — O99331 Smoking (tobacco) complicating pregnancy, first trimester: Secondary | ICD-10-CM | POA: Insufficient documentation

## 2015-11-19 DIAGNOSIS — O039 Complete or unspecified spontaneous abortion without complication: Secondary | ICD-10-CM

## 2015-11-19 LAB — CBC
HEMATOCRIT: 27.7 % — AB (ref 36.0–46.0)
HEMOGLOBIN: 9.9 g/dL — AB (ref 12.0–15.0)
MCH: 30.1 pg (ref 26.0–34.0)
MCHC: 35.7 g/dL (ref 30.0–36.0)
MCV: 84.2 fL (ref 78.0–100.0)
Platelets: 201 10*3/uL (ref 150–400)
RBC: 3.29 MIL/uL — AB (ref 3.87–5.11)
RDW: 12.4 % (ref 11.5–15.5)
WBC: 7.9 10*3/uL (ref 4.0–10.5)

## 2015-11-19 LAB — HCG, QUANTITATIVE, PREGNANCY: hCG, Beta Chain, Quant, S: 786 m[IU]/mL — ABNORMAL HIGH (ref <5)

## 2015-11-19 MED ORDER — IBUPROFEN 600 MG PO TABS: 600.0000 mg | ORAL_TABLET | Freq: Four times a day (QID) | ORAL | 0 refills | Status: DC | PRN

## 2015-11-19 MED ORDER — KETOROLAC TROMETHAMINE 60 MG/2ML IM SOLN
60.0000 mg | Freq: Once | INTRAMUSCULAR | Status: AC
  Administered 2015-11-19: 60 mg via INTRAMUSCULAR
  Filled 2015-11-19: qty 2

## 2015-11-19 MED ORDER — MISOPROSTOL 200 MCG PO TABS: 800.0000 ug | ORAL_TABLET | Freq: Once | ORAL | 0 refills | Status: DC

## 2015-11-19 NOTE — MAU Note (Signed)
Pt states that about 3 weeks ago she started spotting.  Pt states she spotted for two weeks.  Pt states that last Friday she was at a friend's house and she had some bleeding and clotting.  Pt state she went to [facility] Saturday.  Pt states she had a quant of 6000 and was told there was no heartbeat and she should pass everything.  Pt states she is passing big blood clots.  Pt states there is pain in her back.  Pt states, "there is something inside of me that feels like it needs to come out."  Pt states this morning two of the clots were the size of a golf ball.

## 2015-11-19 NOTE — Discharge Instructions (Signed)
[facility] to call and schedule an appointment for you to be seen in 2 weeks.   No sex until you are seen in the clinic. Get your medication from your pharmacy and use as directed.

## 2015-11-19 NOTE — MAU Provider Note (Signed)
History     CSN: [id number]  Arrival date and time: 11/19/15 1517   First Provider Initiated Contact with Patient 11/19/15 1546      Chief Complaint  Patient presents with  . Vaginal Bleeding  . Abdominal Pain   HPI Tina Hawkins 29 y.o. [id number]  Comes in for continued vaginal bleeding.  Was seen at [facility] on 11-11-15 and diagnosed with a probable miscarriage.  Gave history of passing larger clots on 11-10-15 and nothing was seen [facility] the uterus [facility].  Chart notes and labs reviewed.   Has continued to have bleeding this week.  Today had some clots again so she came back for reevaluation.  Continues to have abdominal pain also.     OB History    Gravida Para Term Preterm AB Living   5 1 1   3 1    SAB TAB Ectopic Multiple Live Births   2 1     1       Past Medical History:  Diagnosis Date  . Abscess of thigh 06/13/11  . Asthma    as a child  . Bacterial infection   . Candidiasis   . H/O cystitis   . H/O herpes simplex infection   . Hepatitis B    vaccines x3  . Sickle cell trait (HCC)   . Trichomonas 2009   treated  . Yeast infection     Past Surgical History:  Procedure Laterality Date  . NO PAST SURGERIES      Family History  Problem Relation Age of Onset  . Heart disease Maternal Grandmother   . Heart disease Mother   . Stroke Mother   . Hypertension Mother   . Cancer Maternal Aunt   . Diabetes Maternal Aunt   . Hypertension Maternal Aunt   . Heart disease Maternal Grandfather   . Hypertension Cousin   . Kidney disease Cousin   . Anesthesia problems Neg Hx     Social History  Substance Use Topics  . Smoking status: Current Every Day Smoker    Packs/day: 0.50  . Smokeless tobacco: Never Used  . Alcohol use Yes     Comment: occasionally    Allergies:  Allergies  Allergen Reactions  . Dust Mite Extract Other (See Comments)    Allergy symptoms    Prescriptions Prior to Admission  Medication Sig Dispense Refill Last Dose  .  meclizine (ANTIVERT) 25 MG tablet Take 1 tablet (25 mg total) by mouth 3 (three) times daily as needed for dizziness. (Patient not taking: Reported on 11/11/2015) 12 tablet 0 Not Taking at Unknown time  . ondansetron (ZOFRAN) 4 MG tablet Take 1 tablet (4 mg total) by mouth every 8 (eight) hours as needed for nausea or vomiting. (Patient not taking: Reported on 11/11/2015) 8 tablet 0 Not Taking at Unknown time    Review of Systems  Constitutional: Negative for fever.  Gastrointestinal: Positive for abdominal pain. Negative for nausea and vomiting.  Genitourinary:       No vaginal discharge. Vaginal bleeding with clots No dysuria.   Physical Exam   Blood pressure 111/91, pulse 93, temperature 98.6 F (37 C), temperature source Oral, resp. rate 16, last menstrual period 08/31/2015.  Physical Exam  Genitourinary:  Genitourinary Comments: Speculum exam: Vagina - Moderate sized clot with moderate amount of bright blood, no odor Cervix - small clot seen in the os Bimanual exam: Cervix open - FT Uterus tender with palpation, normal size Adnexa non tender, no  masses bilaterally Chaperone present for exam.     MAU Course  Procedures Results for orders placed or performed during the hospital encounter of 11/19/15 (from the past 24 hour(s))  CBC     Status: Abnormal   Collection Time: 11/19/15  3:42 PM  Result Value Ref Range   WBC 7.9 4.0 - 10.5 K/uL   RBC 3.29 (L) 3.87 - 5.11 MIL/uL   Hemoglobin 9.9 (L) 12.0 - 15.0 g/dL   HCT 11.9 (L) 14.7 - 82.9 %   MCV 84.2 78.0 - 100.0 fL   MCH 30.1 26.0 - 34.0 pg   MCHC 35.7 30.0 - 36.0 g/dL   RDW 56.2 13.0 - 86.5 %   Platelets 201 150 - 400 K/uL  hCG, quantitative, pregnancy     Status: Abnormal   Collection Time: 11/19/15  3:42 PM  Result Value Ref Range   hCG, Beta Chain, Quant, S 786 (H) <5 mIU/mL   CLINICAL DATA:  Vaginal bleeding in first trimester pregnancy  EXAM: TRANSVAGINAL OB ULTRASOUND  TECHNIQUE: Transvaginal ultrasound  was performed for complete evaluation of the gestation as well as the maternal uterus, adnexal regions, and pelvic cul-de-sac.  COMPARISON:  11/11/2015  FINDINGS: Intrauterine gestational sac: Not identified  Yolk sac:  N/A  Embryo:  N/A  Cardiac Activity: N/A  Heart Rate: N/A bpm  Maternal uterus/adnexae:  Fluid collection seen within the endometrial complex at the upper uterine segment on the previous exam is no longer identified. Endometrial complex remains heterogeneous and thickened particularly at the upper uterine segment, which remains more focally thickened and more heterogeneous, without significant internal blood flow within the majority, favoring clot. Minimal flow is seen within a portion of this endometrial complex on color Doppler imaging. No discrete gestational sac identified. No focal uterine mass.  LEFT ovary normal size and morphology 3.8 x 2.3 x 2.5 cm.  RIGHT ovary normal size and morphology 3.2 x 1.7 x 1.7 cm.  No adnexal masses or free pelvic fluid.  IMPRESSION: No intrauterine gestation identified.  Findings are compatible with pregnancy of unknown location. Differential diagnosis includes early intrauterine pregnancy too early to visualize, spontaneous abortion, and ectopic pregnancy. Serial quantitative beta HCG and or followup ultrasound recommended to definitively exclude ectopic pregnancy.  Thickened heterogeneous endometrial complex as above, favor blood/clot. MDM Previous HGB was 11.5, now 9.9 Consult with Dr. Erin Fulling re: plan of care.  Significant drop in quant from 6000 to 786 in 8 days.   Will give Toradol IM for pain and client can follow with PO Ibuprofen at 11 pm tonight.  Assessment and Plan  SAB with continued vaginal bleeding and clots  Plan Will give Cytotec and then have client follow up in clinic in 2 weeks for visit and to establish contraception.  Tina Hawkins 11/19/2015, 4:47 PM

## 2015-12-02 ENCOUNTER — Inpatient Hospital Stay (HOSPITAL_COMMUNITY)
Admission: AD | Admit: 2015-12-02 | Discharge: 2015-12-02 | Disposition: A | Discharge status: Home / Self Care | Payer: Self-pay | Source: Ambulatory Visit | Attending: Obstetrics & Gynecology | Admitting: Obstetrics & Gynecology

## 2015-12-02 ENCOUNTER — Encounter (HOSPITAL_COMMUNITY): Payer: Self-pay | Admitting: *Deleted

## 2015-12-02 DIAGNOSIS — A499 Bacterial infection, unspecified: Secondary | ICD-10-CM

## 2015-12-02 DIAGNOSIS — N760 Acute vaginitis: Secondary | ICD-10-CM

## 2015-12-02 DIAGNOSIS — B9689 Other specified bacterial agents as the cause of diseases classified elsewhere: Secondary | ICD-10-CM

## 2015-12-02 DIAGNOSIS — O035 Genital tract and pelvic infection following complete or unspecified spontaneous abortion: Secondary | ICD-10-CM | POA: Insufficient documentation

## 2015-12-02 LAB — URINALYSIS, ROUTINE W REFLEX MICROSCOPIC
BILIRUBIN URINE: NEGATIVE
Glucose, UA: NEGATIVE mg/dL
Ketones, ur: NEGATIVE mg/dL
Leukocytes, UA: NEGATIVE
Nitrite: NEGATIVE
PH: 6 (ref 5.0–8.0)
Protein, ur: NEGATIVE mg/dL
SPECIFIC GRAVITY, URINE: 1.02 (ref 1.005–1.030)

## 2015-12-02 LAB — HCG, QUANTITATIVE, PREGNANCY: hCG, Beta Chain, Quant, S: 8 m[IU]/mL — ABNORMAL HIGH (ref <5)

## 2015-12-02 LAB — WET PREP, GENITAL
SPERM: NONE SEEN
Trich, Wet Prep: NONE SEEN
Yeast Wet Prep HPF POC: NONE SEEN

## 2015-12-02 LAB — URINE MICROSCOPIC-ADD ON

## 2015-12-02 LAB — CBC
HEMATOCRIT: 30.9 % — AB (ref 36.0–46.0)
Hemoglobin: 10.9 g/dL — ABNORMAL LOW (ref 12.0–15.0)
MCH: 30.1 pg (ref 26.0–34.0)
MCHC: 35.3 g/dL (ref 30.0–36.0)
MCV: 85.4 fL (ref 78.0–100.0)
Platelets: 263 10*3/uL (ref 150–400)
RBC: 3.62 MIL/uL — ABNORMAL LOW (ref 3.87–5.11)
RDW: 12.8 % (ref 11.5–15.5)
WBC: 7 10*3/uL (ref 4.0–10.5)

## 2015-12-02 MED ORDER — METRONIDAZOLE 500 MG PO TABS: 500.0000 mg | ORAL_TABLET | Freq: Two times a day (BID) | ORAL | 0 refills | Status: DC

## 2015-12-02 NOTE — MAU Note (Signed)
Pt had a miscarriage Beg of September given Cytotec and feels like everything passed. B;ed for about 2 weeks. And then 5 days ago it started having pink dark brown  discharge with an odor. Denies any pain or cramping.

## 2015-12-02 NOTE — MAU Provider Note (Signed)
History    Tina Hawkins is a 30yo, [id number] who presents with a brownish, foul smelling discharge since miscarriage 9/3, pt denies any pain.  CSN: [id number]  Arrival date & time 12/02/15  1449   None     Chief Complaint  Patient presents with  . Vaginal Bleeding    HPI  Past Medical History:  Diagnosis Date  . Abscess of thigh 06/13/11  . Asthma    as a child  . Bacterial infection   . Candidiasis   . H/O cystitis   . H/O herpes simplex infection   . Hepatitis B    vaccines x3  . Sickle cell trait (HCC)   . Trichomonas 2009   treated  . Yeast infection     Past Surgical History:  Procedure Laterality Date  . NO PAST SURGERIES      Family History  Problem Relation Age of Onset  . Heart disease Maternal Grandmother   . Heart disease Mother   . Stroke Mother   . Hypertension Mother   . Cancer Maternal Aunt   . Diabetes Maternal Aunt   . Hypertension Maternal Aunt   . Heart disease Maternal Grandfather   . Hypertension Cousin   . Kidney disease Cousin   . Anesthesia problems Neg Hx     Social History  Substance Use Topics  . Smoking status: Current Every Day Smoker    Packs/day: 0.50  . Smokeless tobacco: Never Used  . Alcohol use Yes     Comment: occasionally    OB History    Gravida Para Term Preterm AB Living   5 1 1   4 1    SAB TAB Ectopic Multiple Live Births   3 1     1       Review of Systems  Constitutional: Negative.   HENT: Negative.   Eyes: Negative.   Respiratory: Negative.   Cardiovascular: Negative.   Gastrointestinal: Negative.   Endocrine: Negative.   Genitourinary: Positive for vaginal discharge.  Musculoskeletal: Negative.   Skin: Negative.   Allergic/Immunologic: Negative.   Neurological: Negative.   Hematological: Negative.   Psychiatric/Behavioral: Negative.     Allergies  Dust mite extract  Home Medications    BP 119/74   Pulse 89   Temp 98.7 F (37.1 C) (Oral)   Resp 18   LMP 08/31/2015   Breastfeeding?  Unknown   Physical Exam  Constitutional: She is oriented to person, place, and time. She appears well-developed and well-nourished.  HENT:  Head: Normocephalic and atraumatic.  Eyes: Conjunctivae are normal. Pupils are equal, round, and reactive to light.  Neck: Normal range of motion.  Cardiovascular: Normal rate and regular rhythm.   Pulmonary/Chest: Effort normal and breath sounds normal.  Abdominal: Soft. Bowel sounds are normal.  Genitourinary: Vagina normal and uterus normal.  Musculoskeletal: Normal range of motion.  Neurological: She is alert and oriented to person, place, and time.  Skin: Skin is warm and dry.  Psychiatric: She has a normal mood and affect. Her behavior is normal. Judgment and thought content normal.    MAU Course  Procedures (including critical care time)  Labs Reviewed  CBC - Abnormal; Notable for the following:       Result Value   RBC 3.62 (*)    Hemoglobin 10.9 (*)    HCT 30.9 (*)    All other components within normal limits  WET PREP, GENITAL  HCG, QUANTITATIVE, PREGNANCY  URINALYSIS, ROUTINE W REFLEX MICROSCOPIC (NOT AT [facility])  GC/CHLAMYDIA PROBE AMP ([place]) NOT AT [facility]   No results found.   No diagnosis found.    MDM  1. Vag discharge with foul odor  2. Post miscarriage   Plan: Obtain wet prep, GC/CMZ, UA, CBC   Bacterial vaginosis, will treat with Flagyl, f/u with PCP if unresolved

## 2015-12-02 NOTE — Discharge Instructions (Signed)

## 2015-12-04 LAB — GC/CHLAMYDIA PROBE AMP (~~LOC~~) NOT AT ARMC
Chlamydia: NEGATIVE
NEISSERIA GONORRHEA: NEGATIVE

## 2015-12-18 ENCOUNTER — Encounter: Payer: Self-pay | Admitting: Obstetrics and Gynecology

## 2018-01-12 IMAGING — US US OB TRANSVAGINAL
1 series · 15 of 28 positions shown · non-contrast
Comparison: 11/11/2015

CLINICAL DATA: Vaginal bleeding in first trimester pregnancy

EXAM:
TRANSVAGINAL OB ULTRASOUND
TECHNIQUE: Transvaginal ultrasound was performed for complete evaluation of the
gestation as well as the maternal uterus, adnexal regions, and
pelvic cul-de-sac.

[Series 1: us ob transvaginal · 30 acquisitions, 15 frames shown]
[im 1/30]
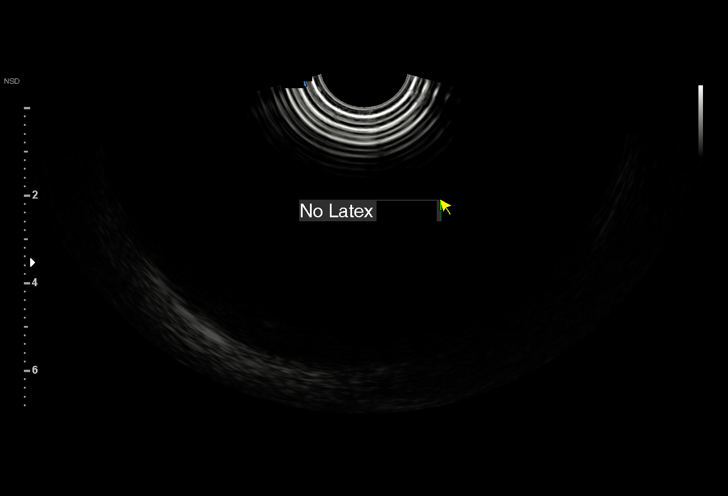
[im 3/30]
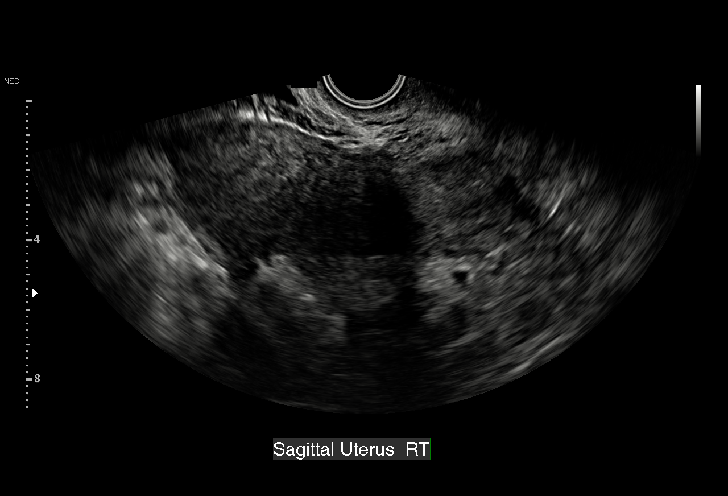
[im 5/30]
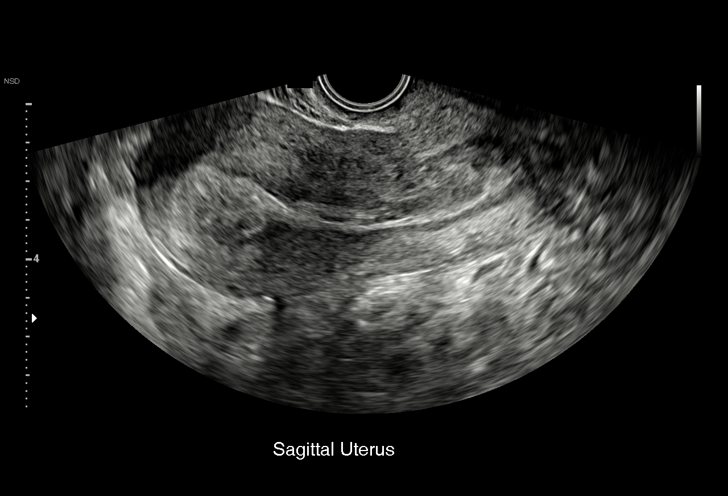
[im 7/30]
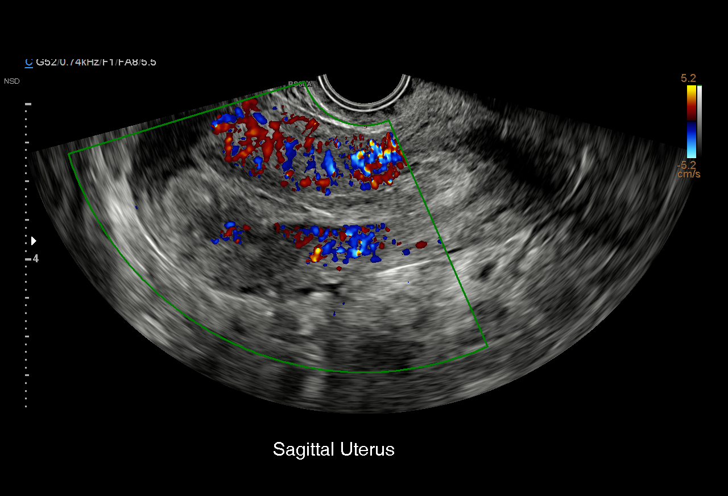
[im 9/30]
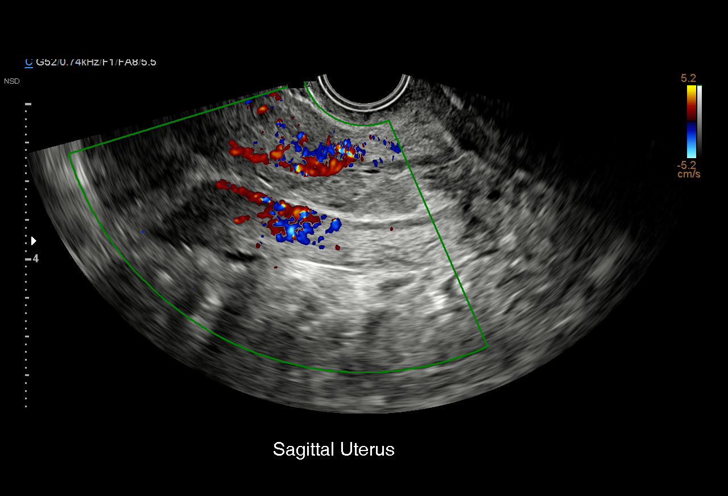
[im 11/30]
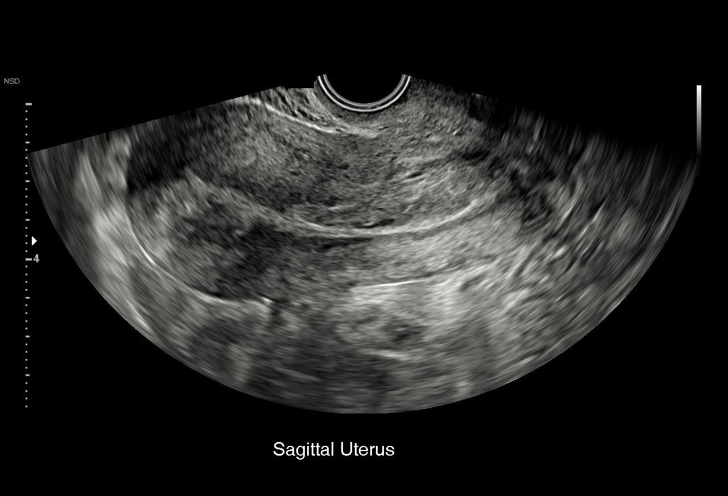
[im 13/30]
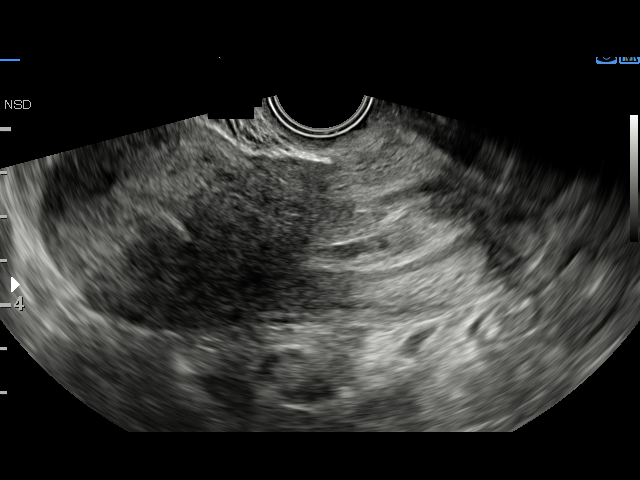
[im 16/30]
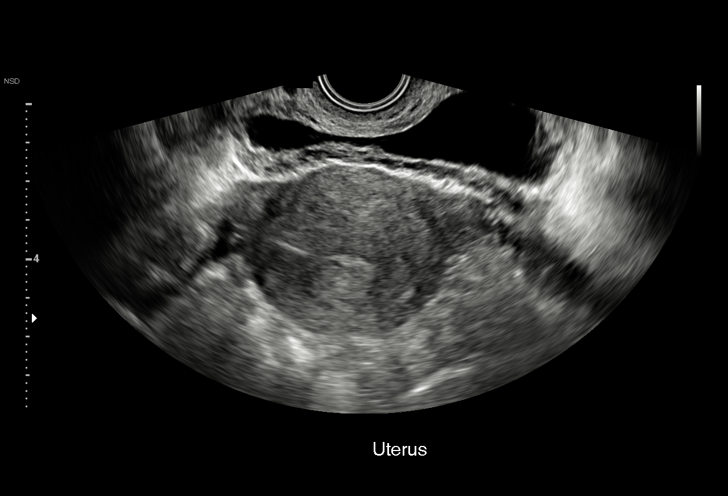
[im 17/30]
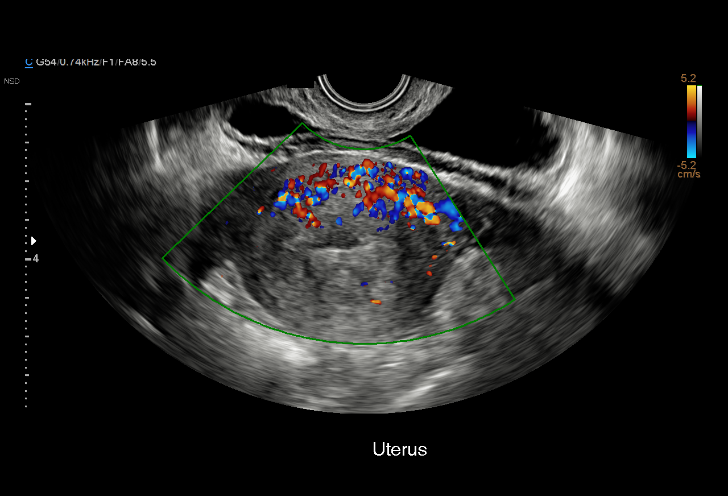
[im 19/30]
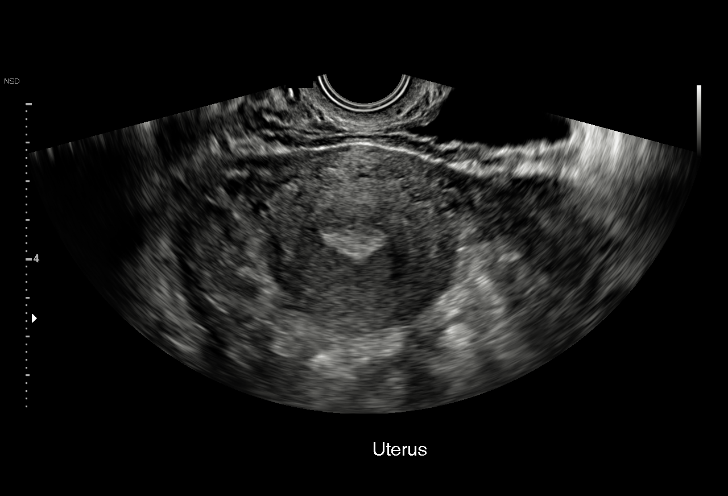
[im 21/30]
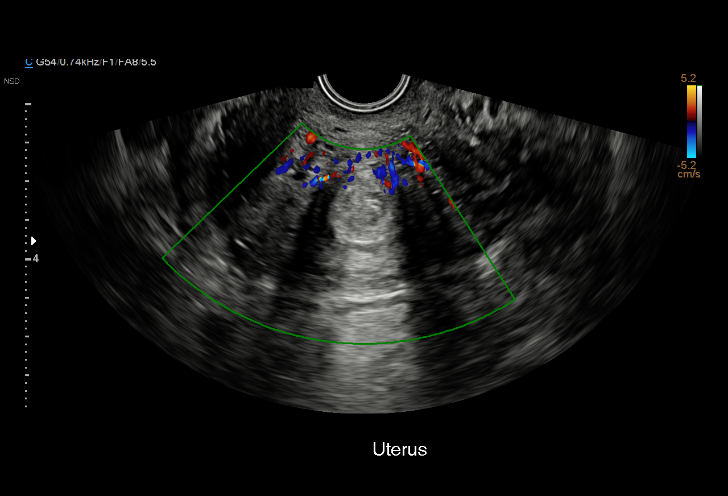
[im 23/30]
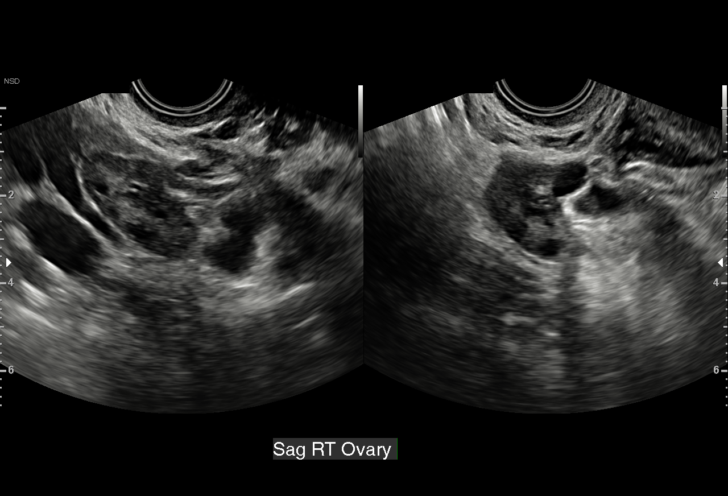
[im 25/30]
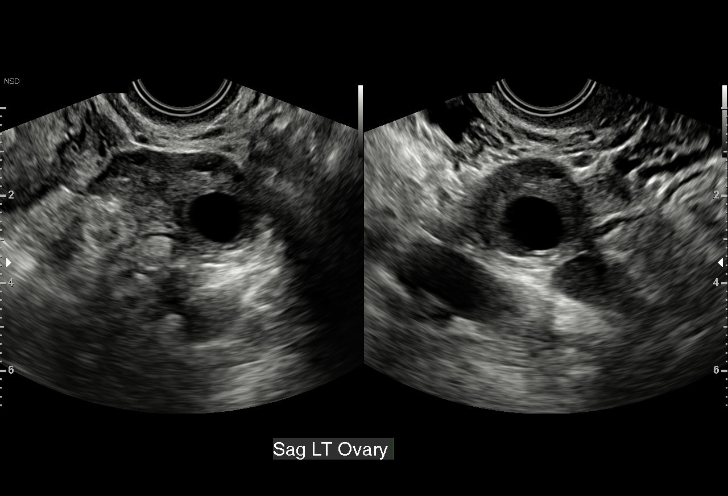
[im 27/30]
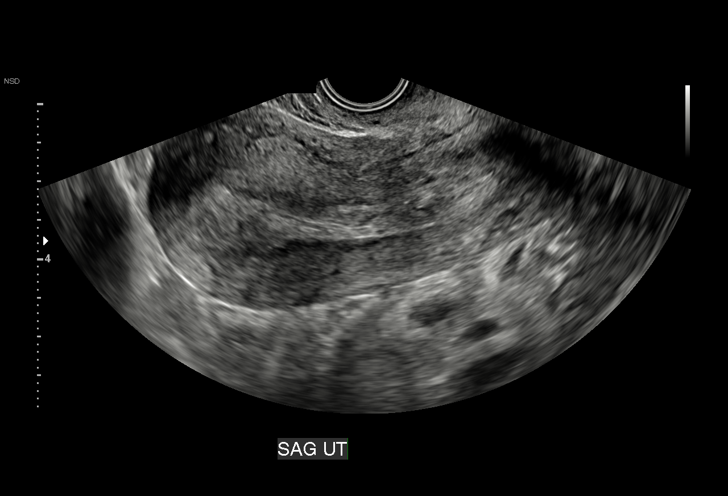
[im 30/30]
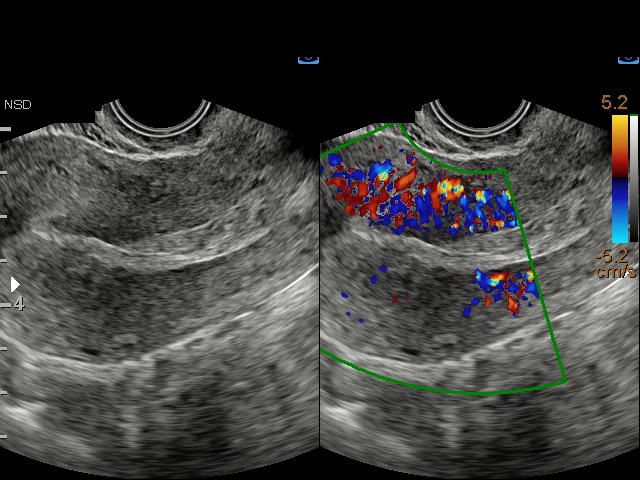

[15 of 28 positions shown; findings below may reference images not displayed]

FINDINGS: Intrauterine gestational sac: Not identified

Yolk sac:  N/A

Embryo:  N/A

Cardiac Activity: N/A

Heart Rate: N/A bpm

Maternal uterus/adnexae:

Fluid collection seen within the endometrial complex at the upper
uterine segment on the previous exam is no longer identified.
Endometrial complex remains heterogeneous and thickened particularly
at the upper uterine segment, which remains more focally thickened
and more heterogeneous, without significant internal blood flow
within the majority, favoring clot. Minimal flow is seen within a
portion of this endometrial complex on color Doppler imaging. No
discrete gestational sac identified. No focal uterine mass.

LEFT ovary normal size and morphology 3.8 x 2.3 x 2.5 cm.

RIGHT ovary normal size and morphology 3.2 x 1.7 x 1.7 cm.

No adnexal masses or free pelvic fluid.
IMPRESSION: No intrauterine gestation identified.

Findings are compatible with pregnancy of unknown location.
Differential diagnosis includes early intrauterine pregnancy too
early to visualize, spontaneous abortion, and ectopic pregnancy.
Serial quantitative beta HCG and or followup ultrasound recommended
to definitively exclude ectopic pregnancy.

Thickened heterogeneous endometrial complex as above, favor
blood/clot.

## 2018-09-10 ENCOUNTER — Ambulatory Visit
Admission: EM | Admit: 2018-09-10 | Discharge: 2018-09-10 | Disposition: A | Discharge status: Home / Self Care | Payer: Self-pay | Attending: Family Medicine | Admitting: Family Medicine

## 2018-09-10 ENCOUNTER — Encounter: Payer: Self-pay | Admitting: Family Medicine

## 2018-09-10 ENCOUNTER — Other Ambulatory Visit: Payer: Self-pay

## 2018-09-10 DIAGNOSIS — B9689 Other specified bacterial agents as the cause of diseases classified elsewhere: Secondary | ICD-10-CM

## 2018-09-10 DIAGNOSIS — N760 Acute vaginitis: Secondary | ICD-10-CM

## 2018-09-10 MED ORDER — FLUCONAZOLE 150 MG PO TABS: ORAL_TABLET | ORAL | 0 refills | Status: DC

## 2018-09-10 NOTE — ED Notes (Signed)
Patient able to ambulate independently  

## 2018-09-10 NOTE — ED Provider Notes (Signed)
[facility]   [id number] 09/10/18 Arrival Time: 1317  ASSESSMENT & PLAN:  1. Acute vaginitis    Declines STI/HIV/RPR testing. Will treat for likely yeast infection.  Meds ordered this encounter  Medications  . fluconazole (DIFLUCAN) 150 MG tablet    Sig: Take one tablet by mouth as a single dose. May repeat in 3 days if symptoms persist.    Dispense:  2 tablet    Refill:  0   Reviewed expectations re: course of current medical issues. Questions answered. Outlined signs and symptoms indicating need for more acute intervention. Patient verbalized understanding. After Visit Summary given.   SUBJECTIVE:  Tina Hawkins is a 33 y.o. female who presents with complaint of vaginal irritation with white "clumpy" discharge. Also vaginal itching. H/O yeast infections that feel similar. No concern for SIT. Onset gradual. First noticed a few days ago. Denies: urinary frequency, hematuria, chills and sweats. Afebrile. No abdominal or pelvic pain. No n/v. No rashes or lesions. OTC treatment: yeast cream without relief.  Patient's last menstrual period was 09/03/2018.  ROS: As per HPI.  OBJECTIVE:  Vitals:   09/10/18 1323  BP: 108/68  Pulse: 85  Resp: 16  Temp: 98.5 F (36.9 C)  TempSrc: Oral  SpO2: 98%     General appearance: alert, cooperative, appears stated age and no distress Throat: lips, mucosa, and tongue normal; teeth and gums normal CV: RRR Lungs: CTAB Back: no CVA tenderness; FROM at waist Abdomen: soft, non-tender GU: deferred Skin: warm and dry Psychological: alert and cooperative; normal mood and affect.   Allergies  Allergen Reactions  . Dust Mite Extract Other (See Comments)    Allergy symptoms    Past Medical History:  Diagnosis Date  . Abscess of thigh 06/13/11  . Asthma    as a child  . Bacterial infection   . Candidiasis   . H/O cystitis   . H/O herpes simplex infection   . Hepatitis B    vaccines x3  . Sickle cell trait (HCC)    . Trichomonas 2009   treated  . Yeast infection    Family History  Problem Relation Age of Onset  . Heart disease Maternal Grandmother   . Heart disease Mother   . Stroke Mother   . Hypertension Mother   . Cancer Maternal Aunt   . Diabetes Maternal Aunt   . Hypertension Maternal Aunt   . Heart disease Maternal Grandfather   . Hypertension Cousin   . Kidney disease Cousin   . Anesthesia problems Neg Hx    Social History   Socioeconomic History  . Marital status: Single    Spouse name: Not on file  . Number of children: Not on file  . Years of education: Not on file  . Highest education level: Not on file  Occupational History  . Not on file  Social Needs  . Financial resource strain: Not on file  . Food insecurity    Worry: Not on file    Inability: Not on file  . Transportation needs    Medical: Not on file    Non-medical: Not on file  Tobacco Use  . Smoking status: Current Every Day Smoker    Packs/day: 0.50  . Smokeless tobacco: Never Used  Substance and Sexual Activity  . Alcohol use: Yes    Comment: occasionally  . Drug use: Yes    Types: Marijuana    Comment: "I use it everyday"  . Sexual activity: Yes  Birth control/protection: Coitus interruptus, Condom  Lifestyle  . Physical activity    Days per week: Not on file    Minutes per session: Not on file  . Stress: Not on file  Relationships  . Social Herbalist on phone: Not on file    Gets together: Not on file    Attends religious service: Not on file    Active member of club or organization: Not on file    Attends meetings of clubs or organizations: Not on file    Relationship status: Not on file  . Intimate partner violence    Fear of current or ex partner: Not on file    Emotionally abused: Not on file    Physically abused: Not on file    Forced sexual activity: Not on file  Other Topics Concern  . Not on file  Social History Narrative  . Not on file          Vanessa Kick, MD 09/10/18 [phone]

## 2018-09-10 NOTE — ED Triage Notes (Signed)
Pt presents to [facility] for assessment of vaginal discharge (white and clumpy), with irritation and itchiness.  States she took 1 OTC treatment that "just seemed to make it worse".

## 2019-12-21 ENCOUNTER — Other Ambulatory Visit: Payer: Medicaid Other

## 2019-12-21 DIAGNOSIS — Z20822 Contact with and (suspected) exposure to covid-19: Secondary | ICD-10-CM

## 2019-12-23 LAB — SARS-COV-2, NAA 2 DAY TAT

## 2019-12-23 LAB — NOVEL CORONAVIRUS, NAA: SARS-CoV-2, NAA: NOT DETECTED

## 2020-01-11 ENCOUNTER — Encounter (HOSPITAL_COMMUNITY): Payer: Self-pay

## 2020-01-11 ENCOUNTER — Ambulatory Visit (HOSPITAL_COMMUNITY)
Admission: EM | Admit: 2020-01-11 | Discharge: 2020-01-11 | Disposition: A | Discharge status: Home / Self Care | Payer: Medicaid Other | Attending: Urgent Care | Admitting: Urgent Care

## 2020-01-11 ENCOUNTER — Other Ambulatory Visit: Payer: Self-pay

## 2020-01-11 DIAGNOSIS — J019 Acute sinusitis, unspecified: Secondary | ICD-10-CM

## 2020-01-11 DIAGNOSIS — R0981 Nasal congestion: Secondary | ICD-10-CM

## 2020-01-11 DIAGNOSIS — R42 Dizziness and giddiness: Secondary | ICD-10-CM

## 2020-01-11 DIAGNOSIS — R519 Headache, unspecified: Secondary | ICD-10-CM

## 2020-01-11 MED ORDER — PROMETHAZINE-DM 6.25-15 MG/5ML PO SYRP: 5.0000 mL | ORAL_SOLUTION | Freq: Every evening | ORAL | 0 refills | Status: DC | PRN

## 2020-01-11 MED ORDER — BENZONATATE 100 MG PO CAPS: 100.0000 mg | ORAL_CAPSULE | Freq: Three times a day (TID) | ORAL | 0 refills | Status: DC | PRN

## 2020-01-11 MED ORDER — PSEUDOEPHEDRINE HCL 60 MG PO TABS: 60.0000 mg | ORAL_TABLET | Freq: Three times a day (TID) | ORAL | 0 refills | Status: DC | PRN

## 2020-01-11 MED ORDER — AMOXICILLIN 875 MG PO TABS: 875.0000 mg | ORAL_TABLET | Freq: Two times a day (BID) | ORAL | 0 refills | Status: DC

## 2020-01-11 MED ORDER — CETIRIZINE HCL 10 MG PO TABS: 10.0000 mg | ORAL_TABLET | Freq: Every day | ORAL | 0 refills | Status: AC

## 2020-01-11 NOTE — ED Triage Notes (Signed)
Pt presents with cough, nasal congestion., dizziness and headache x 1 week. States dizziness is worse today. Denies fever, sob.   Pt had a negative COVID test 3 days ago, as she is a new Producer, television/film/video.

## 2020-01-11 NOTE — ED Provider Notes (Signed)
[facility]   MRN: [id number] DOB: 07/23/1985  Subjective:   Tina Hawkins is a 34 y.o. female presenting for 1 week history of persistent and worsening nasal congestion, sinus headache, dizziness, cough.  Patient was tested for COVID-19 through health at work, was negative.  Has been using Mucinex over-the-counter and DayQuil.  Patient has a smoker, smokes marijuana and tobacco products.  Does this daily.  Has a history of asthma in childhood.  Denies chest pain, shortness of breath, wheezing.  Denies loss of sense of taste and smell.  No current facility-administered medications for this encounter.  Current Outpatient Medications:  .  fluconazole (DIFLUCAN) 150 MG tablet, Take one tablet by mouth as a single dose. May repeat in 3 days if symptoms persist., Disp: 2 tablet, Rfl: 0 .  ibuprofen (ADVIL,MOTRIN) 600 MG tablet, Take 1 tablet (600 mg total) by mouth every 6 (six) hours as needed., Disp: 20 tablet, Rfl: 0   Allergies  Allergen Reactions  . Dust Mite Extract Other (See Comments)    Allergy symptoms    Past Medical History:  Diagnosis Date  . Abscess of thigh 06/13/11  . Asthma    as a child  . Bacterial infection   . Candidiasis   . H/O cystitis   . H/O herpes simplex infection   . Hepatitis B    vaccines x3  . Sickle cell trait (HCC)   . Trichomonas 2009   treated  . Yeast infection      Past Surgical History:  Procedure Laterality Date  . NO PAST SURGERIES      Family History  Problem Relation Age of Onset  . Heart disease Maternal Grandmother   . Heart disease Mother   . Stroke Mother   . Hypertension Mother   . Cancer Maternal Aunt   . Diabetes Maternal Aunt   . Hypertension Maternal Aunt   . Heart disease Maternal Grandfather   . Hypertension Cousin   . Kidney disease Cousin   . Anesthesia problems Neg Hx     Social History   Tobacco Use  . Smoking status: Current Every Day Smoker    Packs/day: 0.50  . Smokeless  tobacco: Never Used  Substance Use Topics  . Alcohol use: Yes    Comment: occasionally  . Drug use: Yes    Types: Marijuana    Comment: "I use it everyday"    ROS   Objective:   Vitals: BP 130/70 (BP Location: Left Arm)   Pulse 96   Temp 97.6 F (36.4 C) (Oral)   Resp 18   LMP  (Within Weeks) Comment: 3 weeks  SpO2 97%   Physical Exam Constitutional:      General: She is not in acute distress.    Appearance: Normal appearance. She is well-developed. She is not ill-appearing, toxic-appearing or diaphoretic.  HENT:     Head: Normocephalic and atraumatic.     Right Ear: Tympanic membrane, ear canal and external ear normal. No drainage or tenderness. No middle ear effusion. Tympanic membrane is not erythematous.     Left Ear: Tympanic membrane, ear canal and external ear normal. No drainage or tenderness.  No middle ear effusion. Tympanic membrane is not erythematous.     Nose: Congestion present. No rhinorrhea.     Mouth/Throat:     Mouth: Mucous membranes are moist. No oral lesions.     Pharynx: No pharyngeal swelling, oropharyngeal exudate, posterior oropharyngeal erythema or uvula swelling.  Tonsils: No tonsillar exudate or tonsillar abscesses.     Comments: Significant postnasal drainage overlying pharynx. Eyes:     General: No scleral icterus.       Right eye: No discharge.        Left eye: No discharge.     Extraocular Movements: Extraocular movements intact.     Right eye: Normal extraocular motion.     Left eye: Normal extraocular motion.     Conjunctiva/sclera: Conjunctivae normal.     Pupils: Pupils are equal, round, and reactive to light.  Cardiovascular:     Rate and Rhythm: Normal rate and regular rhythm.     Pulses: Normal pulses.     Heart sounds: Normal heart sounds. No murmur heard.  No friction rub. No gallop.   Pulmonary:     Effort: Pulmonary effort is normal. No respiratory distress.     Breath sounds: Normal breath sounds. No stridor. No  wheezing, rhonchi or rales.  Musculoskeletal:     Cervical back: Normal range of motion and neck supple.  Lymphadenopathy:     Cervical: No cervical adenopathy.  Skin:    General: Skin is warm and dry.     Findings: No rash.  Neurological:     General: No focal deficit present.     Mental Status: She is alert and oriented to person, place, and time.     Cranial Nerves: No cranial nerve deficit.     Motor: No weakness.     Coordination: Coordination normal.     Gait: Gait normal.     Deep Tendon Reflexes: Reflexes normal.  Psychiatric:        Mood and Affect: Mood normal.        Behavior: Behavior normal.        Thought Content: Thought content normal.        Judgment: Judgment normal.      Assessment and Plan :   PDMP not reviewed this encounter.  1. Acute non-recurrent sinusitis, unspecified location   2. Dizziness   3. Nasal congestion   4. Acute nonintractable headache, unspecified headache type     Will start empiric treatment for sinusitis with amoxicillin.  Recommended supportive care otherwise including the use of oral antihistamine, decongestant, cough suppression medications. Counseled patient on potential for adverse effects with medications prescribed/recommended today, ER and return-to-clinic precautions discussed, patient verbalized understanding.    Wallis Bamberg, [place] 01/11/20 1540

## 2021-03-18 NOTE — L&D Delivery Note (Signed)
LABOR COURSE Patient presented to MAU as RN labor check. She was initially assessed to be 6cm. She transitioned without intervention as MAU RNs were preparing to admit her.   Per RN report Dr. Normand Sloop notified at time of delivery call, communicated to RN staff that she was 25 minutes away and inbound to bedside.  Delivery Note Emergently requested at bedside to attend delivery as patient precipitously changed from 6cm to complete during MAU RN labor evaluation. Felicia RN supporting perineum when I entered room. Baby then crowning before I could don gloves. I wrapped my hands in two layers of bedsheet and laid my hands over Felicia's gloved hands to deliver baby and deliver through nuchal and body cord. Head delivered ROT. Shoulder and body delivered in usual fashion. At 1304 a viable female was delivered via Vaginal, Spontaneous (Presentation: ROT ; ROA).  Infant with spontaneous cry, placed on mother's abdomen, dried and stimulated. Cord clamped x 2 after 3-minute delay, and cut by FOB. Cord blood drawn. Placenta delivered spontaneously with gentle cord traction. Appears intact. Fundus firm with massage and IM Pitocin. Labia, perineum, vagina, and cervix inspected.   APGAR:9, 9; weight pending .   Cord: 3VC with the following complications:None.   Cord pH: Not indicated  Anesthesia:N/A   Episiotomy: None Lacerations: 1st degree perineal, hemostatic, repair not indicated Suture Repair:  N/A Est. Blood Loss (mL): 250  Mom to postpartum.  Baby to Couplet care / Skin to Skin.  Clayton Bibles, CNM 09/29/21 3:16 PM

## 2021-09-29 ENCOUNTER — Encounter (HOSPITAL_COMMUNITY): Payer: Self-pay | Admitting: Obstetrics and Gynecology

## 2021-09-29 ENCOUNTER — Inpatient Hospital Stay (HOSPITAL_COMMUNITY)
Admission: AD | Admit: 2021-09-29 | Discharge: 2021-10-01 | DRG: 807 | Disposition: A | Discharge status: Home / Self Care | Payer: Medicaid Other | Attending: Obstetrics and Gynecology | Admitting: Obstetrics and Gynecology

## 2021-09-29 ENCOUNTER — Other Ambulatory Visit: Payer: Self-pay

## 2021-09-29 DIAGNOSIS — O26893 Other specified pregnancy related conditions, third trimester: Secondary | ICD-10-CM | POA: Diagnosis present

## 2021-09-29 DIAGNOSIS — F17210 Nicotine dependence, cigarettes, uncomplicated: Secondary | ICD-10-CM | POA: Diagnosis present

## 2021-09-29 DIAGNOSIS — Z3A39 39 weeks gestation of pregnancy: Secondary | ICD-10-CM

## 2021-09-29 DIAGNOSIS — O9902 Anemia complicating childbirth: Secondary | ICD-10-CM | POA: Diagnosis present

## 2021-09-29 DIAGNOSIS — D573 Sickle-cell trait: Secondary | ICD-10-CM | POA: Diagnosis present

## 2021-09-29 DIAGNOSIS — O99334 Smoking (tobacco) complicating childbirth: Secondary | ICD-10-CM | POA: Diagnosis present

## 2021-09-29 DIAGNOSIS — O99824 Streptococcus B carrier state complicating childbirth: Secondary | ICD-10-CM | POA: Diagnosis present

## 2021-09-29 LAB — TYPE AND SCREEN
ABO/RH(D): O POS
Antibody Screen: NEGATIVE

## 2021-09-29 MED ORDER — DIPHENHYDRAMINE HCL 25 MG PO CAPS: 25.0000 mg | ORAL_CAPSULE | Freq: Four times a day (QID) | ORAL | Status: DC | PRN

## 2021-09-29 MED ORDER — PRENATAL MULTIVITAMIN CH
1.0000 | ORAL_TABLET | Freq: Every day | ORAL | Status: DC
  Administered 2021-09-30 – 2021-10-01 (×2): 1 via ORAL
  Filled 2021-09-29 (×2): qty 1

## 2021-09-29 MED ORDER — ONDANSETRON HCL 4 MG PO TABS: 4.0000 mg | ORAL_TABLET | ORAL | Status: DC | PRN

## 2021-09-29 MED ORDER — ONDANSETRON HCL 4 MG/2ML IJ SOLN: 4.0000 mg | INTRAMUSCULAR | Status: DC | PRN

## 2021-09-29 MED ORDER — BENZOCAINE-MENTHOL 20-0.5 % EX AERO: 1.0000 | INHALATION_SPRAY | CUTANEOUS | Status: DC | PRN

## 2021-09-29 MED ORDER — ERYTHROMYCIN 5 MG/GM OP OINT
TOPICAL_OINTMENT | OPHTHALMIC | Status: AC
  Filled 2021-09-29: qty 1

## 2021-09-29 MED ORDER — COCONUT OIL OIL: 1.0000 | TOPICAL_OIL | Status: DC | PRN

## 2021-09-29 MED ORDER — DIBUCAINE (PERIANAL) 1 % EX OINT: 1.0000 | TOPICAL_OINTMENT | CUTANEOUS | Status: DC | PRN

## 2021-09-29 MED ORDER — ACETAMINOPHEN 325 MG PO TABS
650.0000 mg | ORAL_TABLET | ORAL | Status: DC | PRN
  Administered 2021-09-29 – 2021-10-01 (×4): 650 mg via ORAL
  Filled 2021-09-29 (×4): qty 2

## 2021-09-29 MED ORDER — IBUPROFEN 600 MG PO TABS
600.0000 mg | ORAL_TABLET | Freq: Once | ORAL | Status: AC
  Administered 2021-09-29: 600 mg via ORAL
  Filled 2021-09-29: qty 1

## 2021-09-29 MED ORDER — SIMETHICONE 80 MG PO CHEW: 80.0000 mg | CHEWABLE_TABLET | ORAL | Status: DC | PRN

## 2021-09-29 MED ORDER — MAGNESIUM HYDROXIDE 400 MG/5ML PO SUSP: 30.0000 mL | ORAL | Status: DC | PRN

## 2021-09-29 MED ORDER — IBUPROFEN 600 MG PO TABS
600.0000 mg | ORAL_TABLET | Freq: Four times a day (QID) | ORAL | Status: DC
  Administered 2021-09-29 – 2021-10-01 (×7): 600 mg via ORAL
  Filled 2021-09-29 (×7): qty 1

## 2021-09-29 MED ORDER — WITCH HAZEL-GLYCERIN EX PADS: 1.0000 | MEDICATED_PAD | CUTANEOUS | Status: DC | PRN

## 2021-09-29 MED ORDER — OXYTOCIN 10 UNIT/ML IJ SOLN
INTRAMUSCULAR | Status: AC
  Administered 2021-09-29: 10 [IU] via INTRAMUSCULAR
  Filled 2021-09-29: qty 1

## 2021-09-29 NOTE — Progress Notes (Signed)
Dr. Normand Sloop phoned unit for update.  MD informed pt delivered both baby & placenta and in stable condition.  MD states she's en route to hospital but currently stuck in traffic.  MD requested RN enter PP orders.

## 2021-09-29 NOTE — MAU Note (Signed)
Tina Hawkins is a 36 y.o. at [redacted]w[redacted]d here in MAU reporting: regualr ctxs and ROM.  Reports water broke @ approx 1230, unsure of color of fluid.  Onset of complaint: 0630 Pain score: 10 There were no vitals filed for this visit.   FHT:140's Lab orders placed from triage:   None

## 2021-09-29 NOTE — Lactation Note (Signed)
This note was copied from a baby's chart. Lactation Consultation Note  Patient Name: Tina Hawkins Date: 09/29/2021 Reason for consult: Initial assessment;Term Age:34 hours Mom requested latch assistance. Mom latched infant on her left breast using the football hold position, infant latched with depth and BF for 10 minutes. Mom knows to continue to breastfeed infant according to cues, on demand, 8 to 12+ times within 24 hours, skin to skin. Mom knows to call RN/LC if she has BF questions, concerns or needs further latch assistance. Mom knows if infant is still cuing after latching on the 1st breast to offer the 2nd breast within the same feeding. Mom would like hand pump before discharge due to not having breast pump at home and interested in applying for [facility], LC left form in room. Mom made aware of O/P services, breastfeeding support groups, community resources, and our phone # for post-discharge questions.   Maternal Data Does the patient have breastfeeding experience prior to this delivery?: No  Feeding Mother's Current Feeding Choice: Breast Milk  LATCH Score Latch: Grasps breast easily, tongue down, lips flanged, rhythmical sucking.  Audible Swallowing: Spontaneous and intermittent  Type of Nipple: Everted at rest and after stimulation  Comfort (Breast/Nipple): Soft / non-tender  Hold (Positioning): Assistance needed to correctly position infant at breast and maintain latch.  LATCH Score: 9   Lactation Tools Discussed/Used    Interventions    Discharge    Consult Status Consult Status: Follow-up Date: 09/30/21 Follow-up type: In-patient    Danelle Earthly 09/29/2021, 10:07 PM

## 2021-09-29 NOTE — H&P (Signed)
Tina Hawkins is a 36 y.o. female presenting for in labor and delivered in MAU.  She broke her water at aroud 12:30 today and started contractions that were very intense and bought her to the hospital. OB History     Gravida  6   Para  2   Term  2   Preterm      AB  4   Living  2      SAB  3   IAB  1   Ectopic      Multiple  0   Live Births  2          Past Medical History:  Diagnosis Date   Abscess of thigh 06/13/11   Asthma    as a child   Bacterial infection    Candidiasis    H/O cystitis    H/O herpes simplex infection    Hepatitis B    vaccines x3   Sickle cell trait (HCC)    Trichomonas 2009   treated   Yeast infection    Past Surgical History:  Procedure Laterality Date   NO PAST SURGERIES     Family History: family history includes Cancer in her maternal aunt; Diabetes in her maternal aunt; Heart disease in her maternal grandfather, maternal grandmother, and mother; Hypertension in her cousin, maternal aunt, and mother; Kidney disease in her cousin; Stroke in her mother. Social History:  reports that she has been smoking. She has been smoking an average of .5 packs per day. She has never used smokeless tobacco. She reports current alcohol use. She reports current drug use. Drug: Marijuana.     Maternal Diabetes: No Genetic Screening: Normal Maternal Ultrasounds/Referrals: Normal Fetal Ultrasounds or other Referrals:  None Maternal Substance Abuse:  No Significant Maternal Medications:  None Significant Maternal Lab Results:  Group B Strep positive Other Comments:  None  Review of Systems History Dilation: 10 Effacement (%): 100 Station: Crowning Exam by:: F. Morris, RNC Blood pressure 116/77, pulse 94, temperature 98.3 F (36.8 C), resp. rate (!) 22, SpO2 100 %, unknown if currently breastfeeding. Exam Physical Exam  Physical Examination: General appearance - alert, well appearing, and in no distress Chest - clear to auscultation,  no wheezes, rales or rhonchi, symmetric air entry Heart - normal rate and regular rhythm  Abd fundus is firm NT  Gu  lochia pressent EXT homans sign neg Prenatal labs: ABO, Rh: --/--/O POS (07/15 1330) Antibody: NEG (07/15 1330) Rubella:  imm RPR:   neg HBsAg:   neg HIV:   nr GBS: pos    Assessment/Plan: Precipitous delivery at term Prenatal labs given to Peds Routine care   Mccauley Diehl A Chavonne Sforza 09/29/2021, 5:31 PM

## 2021-09-30 LAB — CBC
HCT: 29.2 % — ABNORMAL LOW (ref 36.0–46.0)
Hemoglobin: 10.5 g/dL — ABNORMAL LOW (ref 12.0–15.0)
MCH: 32.5 pg (ref 26.0–34.0)
MCHC: 36 g/dL (ref 30.0–36.0)
MCV: 90.4 fL (ref 80.0–100.0)
Platelets: 216 10*3/uL (ref 150–400)
RBC: 3.23 MIL/uL — ABNORMAL LOW (ref 3.87–5.11)
RDW: 12.6 % (ref 11.5–15.5)
WBC: 16.3 10*3/uL — ABNORMAL HIGH (ref 4.0–10.5)
nRBC: 0 % (ref 0.0–0.2)

## 2021-09-30 LAB — RPR: RPR Ser Ql: NONREACTIVE

## 2021-09-30 NOTE — Progress Notes (Signed)
Subjective: Postpartum Day # 1 : S/P NSVD due to pt presented to MAU on 7/15 and had a precipitous delivery, SVD of baby female, GBS+ no treatment, 1st degree laceration, with EBL of , hgb stable at 10.5. Pt has h/o asthma with slight coug but no medication needed at this time. H/O trich not this pregnancy. H/O HSV no lesion this visit. Patient up ad lib, denies syncope or dizziness. Reports consuming regular diet without issues and denies N/V. Patient reports 0 bowel movement + passing flatus.  Denies issues with urination and reports bleeding is "lighter."  Patient is breast and desires to bottle also feeding and reports going well.  Desires undecided for postpartum contraception.  Pain is being appropriately managed with use of po meds.   1st laceration Feeding:  br/bt Contraceptive plan:  undecided Baby female  Objective: Vital signs in last 24 hours: Patient Vitals for the past 24 hrs:  BP Temp Temp src Pulse Resp SpO2  09/30/21 0509 124/61 98.1 F (36.7 C) Oral 86 18 100 %  09/30/21 0014 126/76 98.5 F (36.9 C) Oral 86 16 97 %  09/29/21 2124 117/87 98.3 F (36.8 C) Oral 85 16 100 %  09/29/21 1641 116/77 -- -- 94 -- 100 %  09/29/21 1545 110/67 98.3 F (36.8 C) -- 81 -- 100 %  09/29/21 1406 118/72 -- -- 89 -- --  09/29/21 1319 130/78 97.9 F (36.6 C) Oral 98 (!) 22 --  09/29/21 1303 (!) 173/155 -- -- 90 -- --     Physical Exam:  General: alert, cooperative, and appears stated age Mood/Affect: happy Lungs: clear to auscultation, no wheezes, rales or rhonchi, symmetric air entry.  Heart: normal rate, regular rhythm, normal S1, S2, no murmurs, rubs, clicks or gallops. Breast: breasts appear normal, no suspicious masses, no skin or nipple changes or axillary nodes. Abdomen:  + bowel sounds, soft, non-tender GU: perineum approximate, healing well. No signs of external hematomas.  Uterine Fundus: firm Lochia: appropriate Skin: Warm, Dry. DVT Evaluation: No evidence of DVT  seen on physical exam. Negative Homan's sign. No cords or calf tenderness. No significant calf/ankle edema.     Latest Ref Rng & Units 09/30/2021    5:21 AM 12/02/2015    3:09 PM 11/19/2015    3:42 PM  CBC  WBC 4.0 - 10.5 K/uL 16.3  7.0  7.9   Hemoglobin 12.0 - 15.0 g/dL 75.1  02.5  9.9   Hematocrit 36.0 - 46.0 % 29.2  30.9  27.7   Platelets 150 - 400 K/uL 216  263  201     Results for orders placed or performed during the hospital encounter of 09/29/21 (from the past 24 hour(s))  RPR     Status: None   Collection Time: 09/29/21 12:50 PM  Result Value Ref Range   RPR Ser Ql NON REACTIVE NON REACTIVE  Type and screen [facility]     Status: None   Collection Time: 09/29/21  1:30 PM  Result Value Ref Range   ABO/RH(D) O POS    Antibody Screen NEG    Sample Expiration      10/02/2021,2359 Performed at [facility], [address] [place], [place] [address]   CBC     Status: Abnormal   Collection Time: 09/30/21  5:21 AM  Result Value Ref Range   WBC 16.3 (H) 4.0 - 10.5 K/uL   RBC 3.23 (L) 3.87 - 5.11 MIL/uL   Hemoglobin 10.5 (L)  12.0 - 15.0 g/dL   HCT 24.4 (L) 01.0 - 27.2 %   MCV 90.4 80.0 - 100.0 fL   MCH 32.5 26.0 - 34.0 pg   MCHC 36.0 30.0 - 36.0 g/dL   RDW 53.6 64.4 - 03.4 %   Platelets 216 150 - 400 K/uL   nRBC 0.0 0.0 - 0.2 %     CBG (last 3)  No results for input(s): "GLUCAP" in the last 72 hours.   I/O last 3 completed shifts: In: -  Out: 500 [Blood:500]   Assessment Postpartum Day # 1 : S/P NSVD due to pt presented to MAU on 7/15 and had a precipitous delivery, SVD of baby female, GBS+ no treatment, 1st degree laceration, with EBL of , hgb stable at 10.5. Pt has h/o asthma with slight coug but no medication needed at this time. H/O trich not this pregnancy. H/O HSV no lesion this visit. Pt stable. -1 involution. Breast and bottle feeding. Hemodynamically stable.   Plan: Continue other mgmt as ordered GBS+ no tx plan to stay 48  hours PP HSV no lesion will monitor.  VTE prophylactics: Early ambulated as tolerates.  Pain control: Motrin/Tylenol PRN Education given regarding options for contraception, including barrier methods, injectable contraception, IUD placement, oral contraceptives.  Plan for discharge tomorrow  Dr. Normand Sloop to be updated on patient status  [facility] NP-C, CNM 09/30/2021, 11:36 AM

## 2021-09-30 NOTE — Clinical Social Work Maternal (Signed)
CLINICAL SOCIAL WORK MATERNAL/CHILD NOTE  Patient Details  Name: Tina Hawkins MRN: [id number] Date of Birth: [birth year]  Date:  09/30/2021  Clinical Social Worker Initiating Note:  Idamae Lusher Date/Time: Initiated:  09/30/21/1445     Child's Name:  Tina Hawkins   Biological Parents:  Mother, Father (FOB Tina Hawkins DOB: 05/20/1989. Father does not live in the home.)   Need for Interpreter:  None   Reason for Referral:  Behavioral Health Concerns, Current Substance Use/Substance Use During Pregnancy     Address:  [address] [place] [address]    Phone number:  [phone] (home)     Additional phone number:   Household Members/Support Persons (HM/SP):   Household Member/Support Person 1   HM/SP Name Relationship DOB or Age  HM/SP -Tina Hawkins Son 06/02/2011  HM/SP -2        HM/SP -3        HM/SP -4        HM/SP -5        HM/SP -6        HM/SP -7        HM/SP -8          Natural Supports (not living in the home):  Spouse/significant other, Friends   Chiropodist:     Employment: Unemployed   Type of Work:     Education:  [place] arranged:    Museum/gallery curator Resources:  [employer]   Other Resources:  Physicist, medical  , [place] ([place] referral)   Cultural/Religious Considerations Which May Impact Care:  None identified  Strengths:  Ability to meet basic needs  , Home prepared for child  , Pediatrician chosen   Psychotropic Medications:         Pediatrician:    [place]  Pediatrician List:   [facility]      Pediatrician Fax Number:    Risk Factors/Current Problems:  Substance Use  , Mental Health Concerns  , Family/Relationship Issues     Cognitive State:  Alert  , Goal Oriented  , Linear Thinking  , Able to Concentrate  , Insightful     Mood/Affect:  Calm  , Relaxed   , Comfortable  , Interested     CSW Assessment: CSW met with MOB at bedside to address consult for history of depression and marijuana use during pregnancy and complete psychosocial assessment. When CSW entered room, FOB was present, observed asleep on couch. MOB was bonding with infant in hospital bed. MOB provided verbal consent to speak in front of FOB about anything. CSW introduced self and reasons for consult. MOB presented as polite and forthcoming. MOB remained engaged throughout consult.  CSW inquired about MOB's mental health history. MOB reports she was diagnosed with anxiety at age 36. MOB reports she feels she is able to manage anxiety symptoms well. MOB reports she experienced depression during her pregnancy. MOB explained her pregnancy as "rough", stating that she experienced "heartbreak" due to her and FOB breaking up after being together for 8 years. MOB reports her relationship with FOB "fell apart" when she became pregnant and she did not want the relationship to end. MOB reports she and FOB no longer live together. CSW validated MOB's emotions and provided support. MOB recalls not wanting to get out of bed and  feeling sad during pregnancy. MOB shares she attended therapy and was prescribed zoloft for 3 months during pregnancy through [facility] but discontinued both when she was 4 months pregnant. MOB reports while attending therapy, she felt she was "going to a dark place." MOB reports an improvement of depressive symptoms over the past 4 weeks. MOB identified close friends and FOB as supports. Coping skills identified include talking to friends, listening to music, and going outside.   MOB reports she is currently interested in therapy resources and psychotropic medication management. CSW provided mental health resources and placed a referral for [facility] home visiting program with MOB's consent. CSW inquired how MOB has felt emotionally since giving birth. MOB reports she  is feeling "great" and "in love with her", referring to infant "Tina." MOB reports infant was not planned but now that infant has been born, she is really excited. MOB denied current SI/HI. DV was not assessed due to FOB being present.   MOB reports FOB plans to purchase a car seat prior to infant being discharged. MOB reports she had purchased a car seat online but it will not arrive in time. MOB reports she has a basinet and all other needed baby items. MOB has chosen [facility] for infant care. MOB reports recent financial stress due to depending on FOB for financial support. MOB reports she receives food stamps. MOB encouraged MOB to contact her case worker to have infant added to benefits. MOB reports she has tried to get [facility] benefits through her case worker as Teacher, adult education but has not been contacted yet. CSW placed a [facility] referral for MOB with MOB's verbal consent.   CSW inquired about MOB's substance use during pregnancy. MOB reports she smoked marijuana during her pregnancy due to feeling nauseous and to help her have an appetite. MOB reports she smoked marijuana once per week during her pregnancy and reports her last use of marijuana was "the other day." MOB denies use of other illicit substances during pregnancy. MOB declined substance use resources at this time. MOB inquired if MOB has CPS history. MOB reports when her 34 year old son was born, CPS followed up with her regarding marijuana use while living in [place]. MOB reports CPS case was closed shortly after CPS visited her. CSW informed MOB about hospital drug screen policy due to documented and reported use of MOB using marijuana during pregnancy. CSW explained that infant's UDS and CDS would be monitored and a CPS report would be made if warranted. MOB verbalized understanding.  Of note: CSW was not able to locate MOB's prenatal care records prior to consult due to no records being uploaded in chart. After SW consult, CSW received a  faxed copy of MOB's pnc records from [facility] provider [facility]. MOB initiated pnc at 18 weeks and received good routine care. Copies of MOB's pnc records were placed in MOB's chart and in [facility] box located in infant nursery.  CSW provided education regarding the baby blues period vs. perinatal mood disorders, discussed treatment and gave resources for mental health follow up if concerns arise.  CSW recommends self-evaluation during the postpartum time period using the New Mom Checklist from Postpartum Progress and encouraged MOB to contact a medical professional if symptoms are noted at any time.   CSW provided review of Sudden Infant Death Syndrome (SIDS) precautions.    Infant's UDS was positive for THC. CSW placed call to after hours [place] and made  CPS report. CSW will continue to monitor CDS results and update CPS if warranted.   Per CPS on call weekend social worker, Wes Early, CPS to follow up in 72 hours at MOB's residence. Per CPS, no barriers to discharge at this time.  CSW Plan/Description:  CSW Will Continue to Monitor Umbilical Cord Tissue Drug Screen Results and Make Report if Warranted, Child Protective Service Report  , Other Information/Referral to [place], Perinatal Mood and Anxiety Disorder (PMADs) Education, Sudden Infant Death Syndrome (SIDS) Education    Kenna Gilbert [place], [place] 09/30/2021, 2:51 PM

## 2021-09-30 NOTE — Lactation Note (Signed)
This note was copied from a baby's chart. Lactation Consultation Note  Patient Name: Girl Shawnta Schlegel LKHVF'M Date: 09/30/2021 Reason for consult: Mother's request;Term Age:35 hours  LC in to room per mother's request. Mother is attempting latch and baby is actively cueing. Infant latches with ease. Encourage to "sandwich breast" and do a chin tug as needed to improve latch. Demonstrated alignment. Noted suckling and swallowing. Mother denies discomfort or pain.  Discussed normal infant behavior after 24h, clusterfeeding, normal output. Talked about milk coming into volume.   Feeding plan:  1-Breastfeeding on demand or 8-12 times in 24h  2-Keep infant awake during breastfeeding session: massaging breast, infant's hand/shoulder/feet 3-Encouraged maternal rest, hydration and food intake.   Contact LC as needed for feeds/support/concerns/questions. All questions answered at this time.    Maternal Data Has patient been taught Hand Expression?: Yes Does the patient have breastfeeding experience prior to this delivery?: No  Feeding Mother's Current Feeding Choice: Breast Milk  LATCH Score Latch: Grasps breast easily, tongue down, lips flanged, rhythmical sucking.  Audible Swallowing: Spontaneous and intermittent  Type of Nipple: Everted at rest and after stimulation  Comfort (Breast/Nipple): Soft / non-tender  Hold (Positioning): Assistance needed to correctly position infant at breast and maintain latch.  LATCH Score: 9  Interventions Interventions: Breast feeding basics reviewed;Assisted with latch;Skin to skin;Breast massage;Hand express;Adjust position;Support pillows;Education  Consult Status Consult Status: Follow-up Date: 10/01/21 Follow-up type: In-patient    Jansen Goodpasture A Higuera Ancidey 09/30/2021, 9:15 AM

## 2021-10-01 MED ORDER — BREAST PUMP MISC: 1.0000 | 0 refills | Status: AC | PRN

## 2021-10-01 MED ORDER — OXYCODONE-ACETAMINOPHEN 5-325 MG PO TABS: 1.0000 | ORAL_TABLET | Freq: Four times a day (QID) | ORAL | 0 refills | Status: DC | PRN

## 2021-10-01 MED ORDER — IBUPROFEN 600 MG PO TABS: 600.0000 mg | ORAL_TABLET | Freq: Four times a day (QID) | ORAL | 1 refills | Status: AC | PRN

## 2021-10-01 NOTE — Social Work (Signed)
CSW received consult for " need a car seat.":  CSW met with MOB at bedside and introduced role. CSW informed MOB that the hospital does not have car seats available at this time. MOB stated, "ok." CSW asked MOB if she can get a car seat. MOB nodded  "yes."   Alesandro Stueve, MSW, LCSW [facility]  Clinical Social Worker  [phone] 10/01/2021  1:40 PM  

## 2021-10-01 NOTE — Lactation Note (Signed)
This note was copied from a baby's chart. Lactation Consultation Note  Patient Name: Tina Hawkins [id number] Date: 10/01/2021 Reason for consult: Follow-up assessment;1st time breastfeeding Age:34 hours  Mom's milk has come to volume, breasts are full, leaking and nipples are intact. Mom reports that infant does not feed long at the breast. Infant was recently fed and is content in Dad's arms. Mom will call out for me when she is ready to return to observe a latch. I dropped off a hand pump and will show Mom how to use when I return.   Maternal Data Does the patient have breastfeeding experience prior to this delivery?: No  Feeding Mother's Current Feeding Choice: Breast Milk and Formula  Interventions Interventions: Education  Consult Status Consult Status: Follow-up Date: 10/01/21 Follow-up type: In-patient    Lurline Hare [facility] 10/01/2021, 9:44 AM

## 2021-10-01 NOTE — Lactation Note (Signed)
This note was copied from a baby's chart. Lactation Consultation Note  Patient Name: Tina Hawkins [id number] Date: 10/01/2021 Reason for consult: Mother's request;1st time breastfeeding Age:34 hours  Mom was able to pump 7 oz with the hand pump. Size 24 flange on the R breast/Size 24 or 27 on the L breast (maternal preference).   Plan: 1. Mom to offer breast. If infant has a short feeding and doesn't do well at the breast, Mom will supplement with EBM. If infant does a good job of feeding, Mom does not need to supplement (per Mom, infant fed well at the breast yesterday). Mom to offer a bottle of EBM if she has any doubts as to how well the infant fed.  2. Pump for comfort.  Mom knows how to reach [place] for post-discharge questions and now know about [facility].   Maternal Data Does the patient have breastfeeding experience prior to this delivery?: No  Feeding Mother's Current Feeding Choice: Breast Milk Nipple Type: Extra Slow Flow  LATCH Score    Audible Swallowing:  (a few swallows and then stopped)   Lactation Tools Discussed/Used Tools: Flanges;Pump Flange Size: 24;27 Breast pump type: Manual Pump Education: Milk Storage  Interventions Interventions: Assisted with latch;Education  Discharge Pump: Manual  Consult Status Consult Status: Complete Date: 10/01/21 Follow-up type: In-patient    Lurline Hare [facility] 10/01/2021, 12:34 PM

## 2021-10-01 NOTE — Discharge Summary (Signed)
Physician Discharge Summary  Patient ID: Tina Hawkins MRN: [id number] DOB/AGE: 06/03/1985 36 y.o.  Admit date: 09/29/2021 Discharge date: 10/01/2021  Admission Diagnoses:  Discharge Diagnoses:  Principal Problem:   Vaginal delivery Active Problems:   Normal postpartum course   Discharged Condition: good  Hospital Course: Delivered precipitously in MAU.  PP course was uncomplicated.    Consults: None  Significant Diagnostic Studies: labs: Hgb 10.5  Treatments: IV hydration  Discharge Exam: Blood pressure 107/63, pulse 83, temperature 98.4 F (36.9 C), resp. rate 18, SpO2 100 %, unknown if currently breastfeeding. General appearance: alert and no distress Resp: clear to auscultation bilaterally Cardio: regular rate and rhythm Abd: FF, NT Extremities: no calf tenderness Lochia wnl  Disposition: Discharge disposition: 01-Home or Self Care        Allergies as of 10/01/2021       Reactions   Dust Mite Extract Itching, Other (See Comments)   "Allergy symptoms"        Medication List     STOP taking these medications    amoxicillin 875 MG tablet Commonly known as: AMOXIL   benzonatate 100 MG capsule Commonly known as: TESSALON   promethazine-dextromethorphan 6.25-15 MG/5ML syrup Commonly known as: PROMETHAZINE-DM   pseudoephedrine 60 MG tablet Commonly known as: SUDAFED       TAKE these medications    Breast Pump Misc 1 Pump by Does not apply route as needed.   cetirizine 10 MG tablet Commonly known as: ZyrTEC Allergy Take 1 tablet (10 mg total) by mouth daily.   Folivane-OB 85-1 MG Caps Take 1 capsule by mouth daily with breakfast.   ibuprofen 600 MG tablet Commonly known as: ADVIL Take 1 tablet (600 mg total) by mouth every 6 (six) hours as needed.   oxyCODONE-acetaminophen 5-325 MG tablet Commonly known as: Percocet Take 1 tablet by mouth every 6 (six) hours as needed for severe pain or moderate pain.        Follow-up  Information     [facility] Follow up in 6 week(s).   Specialty: Obstetrics and Gynecology Contact information: [address] [place] [address] [phone]                Signed: Purcell Nails 10/01/2021, 11:43 AM

## 2021-10-08 ENCOUNTER — Telehealth (HOSPITAL_COMMUNITY): Payer: Self-pay | Admitting: *Deleted

## 2021-10-08 NOTE — Telephone Encounter (Signed)
Hospital Discharge Follow-Up Call:  Patient reports that she is doing well.  She says she is having more cramping this week than last week and she is passing small clots.  She denies having clots larger than a golf ball and she denies saturating pads.  She says she is taking ibuprofen for her cramps.  EPDS today was 1 and she endorses this accurately reflects that she is doing well emotionally.  She says that baby is well and she has no concerns about baby's health.  She reports that she has a bassinet for the baby, but that baby sleeps in bed with her some as well.  ABCs of Safe Sleep reviewed.

## 2023-03-30 ENCOUNTER — Ambulatory Visit
Admission: EM | Admit: 2023-03-30 | Discharge: 2023-03-30 | Disposition: A | Discharge status: Home / Self Care | Payer: Medicaid Other | Attending: Family Medicine | Admitting: Family Medicine

## 2023-03-30 ENCOUNTER — Ambulatory Visit (INDEPENDENT_AMBULATORY_CARE_PROVIDER_SITE_OTHER): Payer: Medicaid Other

## 2023-03-30 DIAGNOSIS — S62346A Nondisplaced fracture of base of fifth metacarpal bone, right hand, initial encounter for closed fracture: Secondary | ICD-10-CM

## 2023-03-30 MED ORDER — HYDROCODONE-ACETAMINOPHEN 5-325 MG PO TABS: 1.0000 | ORAL_TABLET | Freq: Four times a day (QID) | ORAL | 0 refills | Status: AC | PRN

## 2023-03-30 NOTE — ED Triage Notes (Signed)
 Pt presents to UC for c/o right hand pain since yesterday. Pt was sledding and stopped with her right hand on a tree. She has been icing and taking ibuprofen

## 2023-03-30 NOTE — ED Provider Notes (Signed)
 [facility]    CSN: [id number] Arrival date & time: 03/30/23  1056      History   Chief Complaint No chief complaint on file.   HPI Tina Hawkins is a 38 y.o. female presents for evaluation of hand pain.  Patient suggested while sitting with her daughter she used her hand to brace and impact with a tree.  Since then she is reporting pain and swelling to the lateral aspect of her right hand.  No numbness or tingling.  No wrist or forearm pain.  No history of injury or surgeries to this area in the past.  She has been taking ibuprofen  and using ice for symptoms since onset.  Denies pregnancy or breast-feeding.  Denies any other injuries or concerns at this time.  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 Authorizing Provider  HYDROcodone -acetaminophen  (NORCO/VICODIN) 5-325 MG tablet Take 1-2 tablets by mouth every 6 (six) hours as needed for severe pain (pain score 7-10). 03/30/23  Yes Rollo Farquhar, Jodi R, NP  cetirizine  (ZYRTEC  ALLERGY) 10 MG tablet Take 1 tablet (10 mg total) by mouth daily. Patient not taking: Reported on 09/29/2021 01/11/20   Christopher Savannah,  PA-C  ibuprofen  (ADVIL ) 600 MG tablet Take 1 tablet (600 mg total) by mouth every 6 (six) hours as needed. 10/01/21   Henry Slough, MD  Misc. Devices (BREAST PUMP) MISC 1 Pump by Does not apply route as needed. 10/01/21   Henry Slough, MD  Prenat w/o A Vit-FeFum-FePo-FA (FOLIVANE-OB) 85-1 MG CAPS Take 1 capsule by mouth daily with breakfast.    [provider]  promethazine  (PHENERGAN ) 12.5 MG tablet Take 12.5 mg by mouth every 6 (six) hours as needed.    06/01/11  [provider]    Family History Family History  Problem Relation Age of Onset   Heart disease Maternal Grandmother    Heart disease Mother    Stroke Mother    Hypertension Mother    Cancer Maternal Aunt    Diabetes Maternal Aunt    Hypertension Maternal Aunt    Heart disease Maternal Grandfather    Hypertension Cousin    Kidney disease Cousin    Anesthesia problems Neg Hx     Social  History Social History   Tobacco Use   Smoking status: Every Day    Current packs/day: 0.50    Types: Cigarettes   Smokeless tobacco: Never  Substance Use Topics   Alcohol use: Yes    Comment: occasionally   Drug use: Yes    Types: Marijuana    Comment: I use it everyday     Allergies   Dust mite extract   Review of Systems Review of Systems  Musculoskeletal:        Right hand pain     Physical Exam Triage Vital Signs ED Triage Vitals  Encounter Vitals Group     BP 03/30/23 1206 119/80     Systolic BP Percentile --      Diastolic BP Percentile --      Pulse Rate 03/30/23 1206 87     Resp 03/30/23 1206 16     Temp 03/30/23 1206 98.4 F (36.9 C)     Temp Source 03/30/23 1206 Oral     SpO2 03/30/23 1206 97 %     Weight --      Height --      Head Circumference --      Peak Flow --      Pain Score 03/30/23 1202 8     Pain Loc --      Pain Education --      Exclude from Growth Chart --    No data found.  Updated Vital Signs BP 119/80 (BP Location: Left Arm)   Pulse 87   Temp 98.4  F (36.9 C) (Oral)   Resp 16   LMP 03/24/2023 (Approximate)   SpO2 97%   Breastfeeding No   Visual Acuity Right Eye Distance:   Left Eye Distance:   Bilateral Distance:    Right Eye Near:   Left Eye Near:    Bilateral Near:     Physical Exam Vitals and nursing note reviewed.  Constitutional:      General: She is not in acute distress.    Appearance: Normal appearance. She is not ill-appearing, toxic-appearing or diaphoretic.  HENT:     Head: Normocephalic and atraumatic.  Eyes:     Pupils: Pupils are equal, round, and reactive to light.  Cardiovascular:     Rate and Rhythm: Normal rate.  Pulmonary:     Effort: Pulmonary effort is normal.  Musculoskeletal:       Hands:     Comments: Moderate swelling of the lateral left hand with tenderness to palpation to the proximal fourth and fifth metatarsals that extends to the distal metatarsal.  Cap refill +2 in all digits and radial pulse +2.  No tenderness to wrist including no snuffbox tenderness.  Reduced range of motion of fingers secondary to swelling.  No obvious deformity.  Skin:    General: Skin is warm and dry.  Neurological:     General: No focal deficit present.     Mental Status: She is alert and oriented to person, place, and time.  Psychiatric:        Mood and Affect: Mood normal.        Behavior: Behavior normal.      UC Treatments / Results  Labs (all labs ordered are listed, but only abnormal results are displayed) Labs Reviewed - No data to display  EKG   Radiology DG Hand Complete Right Result Date: 03/30/2023 CLINICAL DATA:  Hit hand on tree while sledding.  Right hand pain. EXAM: RIGHT HAND - COMPLETE 3+  VIEW COMPARISON:  None Available. FINDINGS: Nondisplaced fracture is seen involving the base of the 5th metacarpal. No other fractures identified. No dislocation. IMPRESSION: Nondisplaced fracture involving the base of the 5th metacarpal. Electronically Signed   By: Norleen DELENA Kil M.D.   On: 03/30/2023  12:27    Procedures Procedures (including critical care time)  Medications Ordered in UC Medications - No data to display  Initial Impression / Assessment and Plan / UC Course  I have reviewed the triage vital signs and the nursing notes.  Pertinent labs & imaging results that were available during my care of the patient were reviewed by me and considered in my medical decision making (see chart for details).     Reviewed exam and symptoms with patient.  X-ray positive for nondisplaced fracture of fifth metacarpal base.  Reviewed with patient.  Patient placed in ulnar gutter fiberglass splint by nursing staff.  Sensation and circulation intact after splint placement.  Advised to keep in place until seen by orthopedics, patient will call orthopedics tomorrow to make follow-up appointment.  Rx Norco sent to pharmacy, side effect profile reviewed.  She instructed not to take any additional OTC Tylenol  while taking this medication, ibuprofen  is okay.  Reviewed RICE therapy as well.  Patient to follow-up with PCP 1 week for recheck.  Strict ER precautions reviewed and patient verbalized understanding. Final Clinical Impressions(s) / UC Diagnoses   Final diagnoses:  Closed nondisplaced fracture of base of fifth metacarpal bone of right hand, initial encounter     Discharge Instructions      Please keep your hand in the splint that was placed while you are in the clinic until you are seen by orthopedics.  Please contact orthopedics tomorrow morning to make a follow-up appointment for further treatment of your injury.  You may still elevate and ice the hand as needed.  You may take Norco every 6 hours as needed for pain.  Please note this medication does contain Tylenol , do not take any additional Tylenol  over-the-counter while you are taking this medication.  You may take ibuprofen  as needed.  Please follow-up with your PCP 1 week for recheck.  Please go to the ER if you develop any worsening  symptoms prior to seeing orthopedics, this includes but is not limited to uncontrolled pain or swelling, persistent numbness or tingling, or any new concerns that arise.  I hope you feel better soon!     ED Prescriptions     Medication Sig Dispense Auth. Provider   HYDROcodone -acetaminophen  (NORCO/VICODIN) 5-325 MG tablet Take 1-2 tablets by mouth every 6 (six) hours as needed for severe pain (pain score 7-10). 12 tablet Leshon Armistead, Jodi R, NP      I have reviewed the PDMP during this encounter.   Loreda Myla SAUNDERS, NP 03/30/23 1324

## 2023-03-30 NOTE — Discharge Instructions (Addendum)
 Please keep your hand in the splint that was placed while you are in the clinic until you are seen by orthopedics.  Please contact orthopedics tomorrow morning to make a follow-up appointment for further treatment of your injury.  You may still elevate and ice the hand as needed.  You may take Norco every 6 hours as needed for pain.  Please note this medication does contain Tylenol , do not take any additional Tylenol  over-the-counter while you are taking this medication.  You may take ibuprofen  as needed.  Please follow-up with your PCP 1 week for recheck.  Please go to the ER if you develop any worsening symptoms prior to seeing orthopedics, this includes but is not limited to uncontrolled pain or swelling, persistent numbness or tingling, or any new concerns that arise.  I hope you feel better soon!

## 2023-04-01 ENCOUNTER — Ambulatory Visit (HOSPITAL_BASED_OUTPATIENT_CLINIC_OR_DEPARTMENT_OTHER): Payer: Medicaid Other | Admitting: Student

## 2023-04-01 ENCOUNTER — Encounter (HOSPITAL_BASED_OUTPATIENT_CLINIC_OR_DEPARTMENT_OTHER): Payer: Self-pay | Admitting: Student

## 2023-04-01 ENCOUNTER — Ambulatory Visit: Payer: Medicaid Other | Admitting: Physician Assistant

## 2023-04-01 DIAGNOSIS — S62346A Nondisplaced fracture of base of fifth metacarpal bone, right hand, initial encounter for closed fracture: Secondary | ICD-10-CM | POA: Diagnosis not present

## 2023-04-01 NOTE — Progress Notes (Signed)
 Chief Complaint: Right hand fracture     History of Present Illness:    Tina Hawkins is a 38 y.o. female presenting today for evaluation of a fracture in her right hand.  3 days ago she was sliding with her 73-year-old daughter which unfortunately ran into a tree and she used her right hand to brace herself.  She was seen in urgent care for x-rays and was placed in a splint.  She rates pain level today at a 7/10 and has been taking Tylenol  and occasional hydrocodone  given by urgent care.  Pain has been worse at night.  She reports a little bit of tingling into the right hand.  She is right-hand dominant.   Surgical History:   None  PMH/PSH/Family History/Social History/Meds/Allergies:    Past Medical History:  Diagnosis Date   Abscess of thigh 06/13/11   Asthma    as a child   Bacterial infection    Candidiasis    H/O cystitis    H/O herpes simplex infection    Hepatitis B    vaccines x3   Sickle cell trait (HCC)    Trichomonas 2009   treated   Yeast infection    Past Surgical History:  Procedure Laterality Date   NO PAST SURGERIES     Social History   Socioeconomic History   Marital status: Single    Spouse name: Not on file   Number of children: Not on file   Years of education: Not on file   Highest education level: Not on file  Occupational History   Not on file  Tobacco Use   Smoking status: Every Day    Current packs/day: 0.50    Types: Cigarettes   Smokeless tobacco: Never  Substance and Sexual Activity   Alcohol use: Yes    Comment: occasionally   Drug use: Yes    Types: Marijuana    Comment: I use it everyday   Sexual activity: Yes    Birth control/protection: Coitus interruptus, Condom  Other Topics Concern   Not on file  Social History Narrative   Not on file   Social Drivers of Health   Financial Resource Strain: Not on file  Food Insecurity: Not on file  Transportation Needs: Not on file   Physical Activity: Not on file  Stress: Not on file  Social Connections: Not on file   Family History  Problem Relation Age of Onset   Heart disease Maternal Grandmother    Heart disease Mother    Stroke Mother    Hypertension Mother    Cancer Maternal Aunt    Diabetes Maternal Aunt    Hypertension Maternal Aunt    Heart disease Maternal Grandfather    Hypertension Cousin    Kidney disease Cousin    Anesthesia problems Neg Hx    Allergies  Allergen Reactions   Dust Mite Extract Itching and Other (See Comments)    Allergy symptoms   Current Outpatient Medications  Medication Sig Dispense Refill   cetirizine  (ZYRTEC  ALLERGY) 10 MG tablet Take 1 tablet (10 mg total) by mouth daily. (Patient not taking: Reported on 09/29/2021) 30 tablet 0   HYDROcodone -acetaminophen  (NORCO/VICODIN) 5-325 MG tablet Take 1-2 tablets by mouth every 6 (six) hours as needed for severe pain (pain score 7-10). 12 tablet 0  ibuprofen  (ADVIL ) 600 MG tablet Take 1 tablet (600 mg total) by mouth every 6 (six) hours as needed. 30 tablet 1   Misc. Devices (BREAST PUMP) MISC 1 Pump by Does not apply route as needed. 1 each 0   Prenat w/o A Vit-FeFum-FePo-FA (FOLIVANE-OB) 85-1 MG CAPS Take 1 capsule by mouth daily with breakfast.     No current facility-administered medications for this visit.   DG Hand Complete Right Result Date: 03/30/2023 CLINICAL DATA:  Hit hand on tree while sledding.  Right hand pain. EXAM: RIGHT HAND - COMPLETE 3+ VIEW COMPARISON:  None Available. FINDINGS: Nondisplaced fracture is seen involving the base of the 5th metacarpal. No other fractures identified. No dislocation. IMPRESSION: Nondisplaced fracture involving the base of the 5th metacarpal. Electronically Signed   By: Norleen DELENA Kil M.D.   On: 03/30/2023 12:27    Review of Systems:   A ROS was performed including pertinent positives and negatives as documented in the HPI.  Physical Exam :   Constitutional: NAD and appears  stated age Neurological: Alert and oriented Psych: Appropriate affect and cooperative Last menstrual period 03/24/2023, not currently breastfeeding.   Comprehensive Musculoskeletal Exam:    Dorsal aspect of the right hand appears moderately swollen particularly on the ulnar side.  Tenderness to palpation over the base and shaft of the fifth metacarpal.  No obvious deformity noted.  Radial pulse 2+.  Distal neurosensory exam is intact.  Imaging:   Xray review from urgent care on 03/30/2023 (right hand 3 views): Nondisplaced vertical fracture of the fifth metacarpal base   I personally reviewed and interpreted the radiographs.   Assessment:   38 y.o. female 3 days status post injury to her right hand resulting in a nondisplaced fifth metacarpal base fracture.  Given the stability of the fracture I recommended proceeding with a removable ulnar gutter brace for immobilization that she can remove for hygiene and icing.  I would like for her to continue with icing and NSAIDs to help reduce inflammation.  She can use the Norco as needed.  Will plan to have her return in 4 weeks to assess healing and reevaluate.  Plan :    - Return to clinic in 4 weeks for repeat xray and reassessment     I personally saw and evaluated the patient, and participated in the management and treatment plan.  Leonce Reveal, PA-C Orthopedics

## 2023-04-29 ENCOUNTER — Ambulatory Visit (HOSPITAL_BASED_OUTPATIENT_CLINIC_OR_DEPARTMENT_OTHER): Payer: Medicaid Other | Admitting: Student

## 2023-05-01 ENCOUNTER — Ambulatory Visit (HOSPITAL_BASED_OUTPATIENT_CLINIC_OR_DEPARTMENT_OTHER): Payer: Medicaid Other | Admitting: Student

## 2024-01-19 ENCOUNTER — Encounter: Payer: Self-pay | Admitting: Radiology
